# Patient Record
Sex: Female | Born: 1975 | Race: Black or African American | Hispanic: No | Marital: Married | Smoking: Never smoker
Health system: Southern US, Community
[De-identification: ages and names within clinical notes are randomized; demographics above are authoritative.]

## PROBLEM LIST (undated history)

## (undated) DIAGNOSIS — M1712 Unilateral primary osteoarthritis, left knee: Secondary | ICD-10-CM

## (undated) DIAGNOSIS — E039 Hypothyroidism, unspecified: Secondary | ICD-10-CM

## (undated) DIAGNOSIS — R87629 Unspecified abnormal cytological findings in specimens from vagina: Secondary | ICD-10-CM

## (undated) DIAGNOSIS — S83106A Unspecified dislocation of unspecified knee, initial encounter: Secondary | ICD-10-CM

## (undated) DIAGNOSIS — M2242 Chondromalacia patellae, left knee: Secondary | ICD-10-CM

## (undated) DIAGNOSIS — M6752 Plica syndrome, left knee: Secondary | ICD-10-CM

## (undated) HISTORY — DX: Unspecified abnormal cytological findings in specimens from vagina: R87.629

---
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
## 2010-08-14 ENCOUNTER — Encounter: Payer: Self-pay | Admitting: Obstetrics & Gynecology

## 2010-08-14 ENCOUNTER — Ambulatory Visit: Payer: Self-pay | Admitting: Obstetrics & Gynecology

## 2010-08-14 LAB — CONVERTED CEMR LAB
Basophils Absolute: 0 10*3/uL (ref 0.0–0.1)
Basophils Relative: 0 % (ref 0–1)
Hepatitis B Surface Ag: NEGATIVE
Hgb A2 Quant: 2.3 % (ref 2.2–3.2)
Hgb A: 97.7 % (ref 96.8–97.8)
Lymphocytes Relative: 25 % (ref 12–46)
MCHC: 33.3 g/dL (ref 30.0–36.0)
Neutro Abs: 7.5 10*3/uL (ref 1.7–7.7)
Neutrophils Relative %: 66 % (ref 43–77)
Platelets: 300 10*3/uL (ref 150–400)
RDW: 13.2 % (ref 11.5–15.5)
Rubella: 173.5 intl units/mL — ABNORMAL HIGH

## 2010-08-21 ENCOUNTER — Ambulatory Visit (HOSPITAL_COMMUNITY): Admission: RE | Admit: 2010-08-21 | Discharge: 2010-08-21 | Payer: Self-pay | Admitting: Obstetrics & Gynecology

## 2010-08-27 ENCOUNTER — Ambulatory Visit: Payer: Self-pay | Admitting: Family Medicine

## 2010-08-28 ENCOUNTER — Encounter: Payer: Self-pay | Admitting: Family Medicine

## 2010-09-18 ENCOUNTER — Ambulatory Visit: Payer: Self-pay | Admitting: Family Medicine

## 2010-09-19 ENCOUNTER — Encounter: Payer: Self-pay | Admitting: Family Medicine

## 2010-09-20 ENCOUNTER — Ambulatory Visit (HOSPITAL_COMMUNITY): Admission: RE | Admit: 2010-09-20 | Discharge: 2010-09-20 | Payer: Self-pay | Admitting: Family Medicine

## 2010-09-20 ENCOUNTER — Encounter: Payer: Self-pay | Admitting: Family Medicine

## 2010-10-03 ENCOUNTER — Ambulatory Visit: Payer: Self-pay | Admitting: Obstetrics & Gynecology

## 2010-10-11 ENCOUNTER — Ambulatory Visit (HOSPITAL_COMMUNITY)
Admission: RE | Admit: 2010-10-11 | Discharge: 2010-10-11 | Payer: Self-pay | Source: Home / Self Care | Admitting: Family Medicine

## 2010-10-18 ENCOUNTER — Encounter: Payer: Self-pay | Admitting: Family Medicine

## 2010-10-18 ENCOUNTER — Ambulatory Visit (HOSPITAL_COMMUNITY): Admission: RE | Admit: 2010-10-18 | Discharge: 2010-10-18 | Payer: Self-pay | Admitting: Family Medicine

## 2010-10-30 ENCOUNTER — Ambulatory Visit: Payer: Self-pay | Admitting: Obstetrics & Gynecology

## 2010-10-30 ENCOUNTER — Encounter: Payer: Self-pay | Admitting: Family Medicine

## 2010-10-30 ENCOUNTER — Ambulatory Visit (HOSPITAL_COMMUNITY)
Admission: RE | Admit: 2010-10-30 | Discharge: 2010-10-30 | Payer: Self-pay | Source: Home / Self Care | Admitting: Family Medicine

## 2010-11-18 ENCOUNTER — Ambulatory Visit: Payer: Self-pay | Admitting: Obstetrics and Gynecology

## 2010-12-04 ENCOUNTER — Ambulatory Visit (HOSPITAL_COMMUNITY)
Admission: RE | Admit: 2010-12-04 | Discharge: 2010-12-04 | Payer: Self-pay | Source: Home / Self Care | Attending: Family Medicine | Admitting: Family Medicine

## 2010-12-04 ENCOUNTER — Encounter: Payer: Self-pay | Admitting: Family Medicine

## 2010-12-16 ENCOUNTER — Ambulatory Visit
Admission: RE | Admit: 2010-12-16 | Discharge: 2010-12-16 | Payer: Self-pay | Source: Home / Self Care | Attending: Obstetrics and Gynecology | Admitting: Obstetrics and Gynecology

## 2011-01-06 ENCOUNTER — Ambulatory Visit
Admission: RE | Admit: 2011-01-06 | Discharge: 2011-01-06 | Payer: Self-pay | Source: Home / Self Care | Attending: Obstetrics and Gynecology | Admitting: Obstetrics and Gynecology

## 2011-01-07 ENCOUNTER — Encounter: Payer: Self-pay | Admitting: Obstetrics and Gynecology

## 2011-01-08 ENCOUNTER — Encounter: Payer: Self-pay | Admitting: Obstetrics and Gynecology

## 2011-01-08 LAB — CONVERTED CEMR LAB
Basophils Absolute: 0 10*3/uL (ref 0.0–0.1)
Basophils Relative: 0 % (ref 0–1)
Eosinophils Relative: 1 % (ref 0–5)
HCT: 35 % — ABNORMAL LOW (ref 36.0–46.0)
Hemoglobin: 10.4 g/dL — ABNORMAL LOW (ref 12.0–15.0)
Lymphocytes Relative: 17 % (ref 12–46)
MCHC: 29.7 g/dL — ABNORMAL LOW (ref 30.0–36.0)
Monocytes Absolute: 0.5 10*3/uL (ref 0.1–1.0)
Monocytes Relative: 4 % (ref 3–12)
RBC: 3.51 M/uL — ABNORMAL LOW (ref 3.87–5.11)
RDW: 16.5 % — ABNORMAL HIGH (ref 11.5–15.5)

## 2011-01-27 ENCOUNTER — Encounter: Payer: 59 | Admitting: Obstetrics & Gynecology

## 2011-01-27 DIAGNOSIS — Z3480 Encounter for supervision of other normal pregnancy, unspecified trimester: Secondary | ICD-10-CM

## 2011-02-10 ENCOUNTER — Encounter: Payer: 59 | Admitting: Obstetrics & Gynecology

## 2011-02-10 DIAGNOSIS — Z3480 Encounter for supervision of other normal pregnancy, unspecified trimester: Secondary | ICD-10-CM

## 2011-02-24 ENCOUNTER — Encounter: Payer: 59 | Admitting: Obstetrics & Gynecology

## 2011-02-24 DIAGNOSIS — Z3480 Encounter for supervision of other normal pregnancy, unspecified trimester: Secondary | ICD-10-CM

## 2011-03-04 ENCOUNTER — Encounter: Payer: 59 | Admitting: Obstetrics and Gynecology

## 2011-03-04 ENCOUNTER — Other Ambulatory Visit: Payer: Self-pay | Admitting: Obstetrics & Gynecology

## 2011-03-04 ENCOUNTER — Encounter: Payer: Self-pay | Admitting: Obstetrics & Gynecology

## 2011-03-04 DIAGNOSIS — O3660X0 Maternal care for excessive fetal growth, unspecified trimester, not applicable or unspecified: Secondary | ICD-10-CM

## 2011-03-04 DIAGNOSIS — Z3480 Encounter for supervision of other normal pregnancy, unspecified trimester: Secondary | ICD-10-CM

## 2011-03-05 ENCOUNTER — Encounter: Payer: Self-pay | Admitting: Obstetrics & Gynecology

## 2011-03-07 ENCOUNTER — Ambulatory Visit (HOSPITAL_COMMUNITY)
Admission: RE | Admit: 2011-03-07 | Discharge: 2011-03-07 | Disposition: A | Discharge status: Home / Self Care | Payer: 59 | Source: Ambulatory Visit | Attending: Obstetrics & Gynecology | Admitting: Obstetrics & Gynecology

## 2011-03-07 DIAGNOSIS — Z3689 Encounter for other specified antenatal screening: Secondary | ICD-10-CM | POA: Insufficient documentation

## 2011-03-07 DIAGNOSIS — O3660X0 Maternal care for excessive fetal growth, unspecified trimester, not applicable or unspecified: Secondary | ICD-10-CM | POA: Insufficient documentation

## 2011-03-10 ENCOUNTER — Encounter: Payer: 59 | Admitting: Obstetrics and Gynecology

## 2011-03-10 DIAGNOSIS — Z3480 Encounter for supervision of other normal pregnancy, unspecified trimester: Secondary | ICD-10-CM

## 2011-03-18 ENCOUNTER — Encounter: Payer: 59 | Admitting: Obstetrics and Gynecology

## 2011-03-18 DIAGNOSIS — Z3480 Encounter for supervision of other normal pregnancy, unspecified trimester: Secondary | ICD-10-CM

## 2011-03-18 DIAGNOSIS — E669 Obesity, unspecified: Secondary | ICD-10-CM

## 2011-03-18 DIAGNOSIS — O99210 Obesity complicating pregnancy, unspecified trimester: Secondary | ICD-10-CM

## 2011-03-24 ENCOUNTER — Encounter: Payer: 59 | Admitting: Obstetrics & Gynecology

## 2011-03-24 DIAGNOSIS — Z3480 Encounter for supervision of other normal pregnancy, unspecified trimester: Secondary | ICD-10-CM

## 2011-03-24 DIAGNOSIS — E669 Obesity, unspecified: Secondary | ICD-10-CM

## 2011-03-24 DIAGNOSIS — O99210 Obesity complicating pregnancy, unspecified trimester: Secondary | ICD-10-CM

## 2011-03-25 ENCOUNTER — Inpatient Hospital Stay (HOSPITAL_COMMUNITY)
Admission: AD | Admit: 2011-03-25 | Discharge: 2011-03-28 | DRG: 774 | Disposition: A | Discharge status: Home / Self Care | Payer: 59 | Source: Ambulatory Visit | Attending: Obstetrics & Gynecology | Admitting: Obstetrics & Gynecology

## 2011-03-25 LAB — CBC
HCT: 35.7 % — ABNORMAL LOW (ref 36.0–46.0)
MCH: 28.8 pg (ref 26.0–34.0)
MCV: 87.9 fL (ref 78.0–100.0)
Platelets: 140 10*3/uL — ABNORMAL LOW (ref 150–400)
RBC: 4.06 MIL/uL (ref 3.87–5.11)

## 2011-03-27 LAB — CBC
HCT: 30.9 % — ABNORMAL LOW (ref 36.0–46.0)
MCH: 28.7 pg (ref 26.0–34.0)
MCV: 90.4 fL (ref 78.0–100.0)
Platelets: 116 10*3/uL — ABNORMAL LOW (ref 150–400)
RBC: 3.42 MIL/uL — ABNORMAL LOW (ref 3.87–5.11)
WBC: 14.2 10*3/uL — ABNORMAL HIGH (ref 4.0–10.5)

## 2011-04-01 ENCOUNTER — Encounter: Payer: 59 | Admitting: Obstetrics and Gynecology

## 2011-04-22 ENCOUNTER — Ambulatory Visit (INDEPENDENT_AMBULATORY_CARE_PROVIDER_SITE_OTHER): Payer: 59 | Admitting: Obstetrics & Gynecology

## 2011-04-22 DIAGNOSIS — O99340 Other mental disorders complicating pregnancy, unspecified trimester: Secondary | ICD-10-CM

## 2011-05-12 ENCOUNTER — Ambulatory Visit (INDEPENDENT_AMBULATORY_CARE_PROVIDER_SITE_OTHER): Payer: 59 | Admitting: Obstetrics and Gynecology

## 2011-05-12 DIAGNOSIS — O99345 Other mental disorders complicating the puerperium: Secondary | ICD-10-CM

## 2011-05-14 ENCOUNTER — Ambulatory Visit: Payer: 59 | Admitting: Obstetrics & Gynecology

## 2012-02-03 ENCOUNTER — Ambulatory Visit (INDEPENDENT_AMBULATORY_CARE_PROVIDER_SITE_OTHER): Payer: 59 | Admitting: Obstetrics & Gynecology

## 2012-02-03 ENCOUNTER — Encounter: Payer: Self-pay | Admitting: Obstetrics & Gynecology

## 2012-02-03 VITALS — BP 106/74 | HR 82 | Ht 65.0 in | Wt 231.0 lb

## 2012-02-03 DIAGNOSIS — F32A Depression, unspecified: Secondary | ICD-10-CM

## 2012-02-03 DIAGNOSIS — F329 Major depressive disorder, single episode, unspecified: Secondary | ICD-10-CM | POA: Insufficient documentation

## 2012-02-03 DIAGNOSIS — F410 Panic disorder [episodic paroxysmal anxiety] without agoraphobia: Secondary | ICD-10-CM

## 2012-02-03 DIAGNOSIS — Z124 Encounter for screening for malignant neoplasm of cervix: Secondary | ICD-10-CM

## 2012-02-03 DIAGNOSIS — N915 Oligomenorrhea, unspecified: Secondary | ICD-10-CM

## 2012-02-03 DIAGNOSIS — Z01419 Encounter for gynecological examination (general) (routine) without abnormal findings: Secondary | ICD-10-CM

## 2012-02-03 DIAGNOSIS — N926 Irregular menstruation, unspecified: Secondary | ICD-10-CM

## 2012-02-03 DIAGNOSIS — Z3009 Encounter for other general counseling and advice on contraception: Secondary | ICD-10-CM

## 2012-02-03 HISTORY — DX: Depression, unspecified: F32.A

## 2012-02-03 HISTORY — DX: Panic disorder (episodic paroxysmal anxiety): F41.0

## 2012-02-03 NOTE — Progress Notes (Signed)
  Subjective:     Cheryl Martinez is a 36 y.o. [phone] female and is here for a comprehensive physical exam. The patient reports having irregular menstrual periods since her delivery in 03/2011. Periods come about every 2-3 months, last for one day.  She has noticed she gained a lot of weight since her delivery.  No other symptoms.  Patient is interested in discussing long term contraception.  Not interested in surgery for now; husband doe snot want vasectomy.  Patient also reports having panic attacks; she is not taking her Effexor anymore.  History   Social History  . Marital Status: Married    Spouse Name: N/A    Number of Children: N/A  . Years of Education: N/A   Occupational History  . Not on file.   Social History Main Topics  . Smoking status: Never Smoker   . Smokeless tobacco: Not on file  . Alcohol Use: No  . Drug Use: No  . Sexually Active: Yes -- Female partner(s)    Birth Control/ Protection: Condom   Other Topics Concern  . Not on file   Social History Narrative  . No narrative on file   Health Maintenance  Topic Date Due  . Pap Smear  10/27/1996  . Tetanus/tdap  10/27/1997  . Influenza Vaccine  09/08/2011    The following portions of the patient's history were reviewed and updated as appropriate: allergies, current medications, past family history, past medical history, past social history, past surgical history and problem list.  Review of Systems Pertinent items are noted in HPI.   Objective:    Blood pressure 106/74, pulse 82, height 5\' 5"  (1.651 m), weight 231 lb (104.781 kg), not currently breastfeeding. GENERAL: Well-developed, well-nourished female in no acute distress.  HEENT: Normocephalic, atraumatic. Sclerae anicteric.  NECK: Supple. Normal thyroid.  LUNGS: Clear to auscultation bilaterally.  HEART: Regular rate and rhythm. BREASTS: Symmetric with everted nipples. No masses, skin changes, nipple drainage, or lymphadenopathy. ABDOMEN: Soft,  obese, nontender, nondistended. No organomegaly. PELVIC: Normal external female genitalia. Vagina is pink and rugated.  Normal discharge. Normal cervix contour. Pap smear obtained. Uterus is normal in size. No adnexal mass or tenderness.  EXTREMITIES: No cyanosis, clubbing, or edema, 2+ distal pulses.   Assessment:   Annual gynecologic exam Long term contraception counseling Oligomenorrhea   Panic attacks Plan:   Follow up pap smear  Reviewed all forms of long term birth control options available including abstinence; over the counter methods; oral contraceptive medication including pill, patch, or ring; Depo-Provera injection; Mirena and Paragard IUDs; permanent sterilization options including vasectomy, tubal ligation, or tubal occlusion using Essure coils. Risks and benefits reviewed.  Questions were answered.  Patient interested in Mirena, risks, benefits reviewed, pamphlet given to patient. She will return for IUD insertion.  As for her oligomenorrhea, will check TSH, PRL, total testosterone, free testosterone in conjuction with her CBC, CMET, fasting lipid panel.  Patient will come back in fasting for these labs and also her Mirena IUD placement  Recommended restarting her Effexor for her panic attacks, offered referral to mental health provider.  Patient declines referral for now.  Will continue to monitor.

## 2012-02-03 NOTE — Patient Instructions (Addendum)
MIRENA Levonorgestrel intrauterine device (IUD) What is this medicine?  MIRENA IUD (is a contraceptive (birth control) device.This type of IUD contains the hormone progestin (synthetic progesterone). The progestin thickens the cervical mucus and prevents sperm from entering the uterus, and it also thins the uterine lining. There is no evidence that the hormone IUD prevents implantation. The hormone IUD can stay in place for up to 5 years.  This medicine may be used for other purposes; ask your health care provider or pharmacist if you have questions. How should I use this medicine? This device is placed inside the uterus by a health care professional. An IUD is the most cost-effective birth control if left in place for the full duration. It may be removed at any time. RISKS AND COMPLICATIONS  Accidental puncture (perforation) of the uterus.   Accidental placement of the IUD either in the muscle layer of the uterus (myometrium) or outside the uterus. If this happen, the IUD can be found essentially floating around the bowels. When this happens, the IUD must be taken out surgically.   The IUD may fall out of the uterus (expulsion). This is more common in women who have recently had a child.    Pregnancy in the fallopian tube (ectopic).  BEFORE THE PROCEDURE  Schedule the IUD insertion for when you will have your menstrual period or right after, to make sure you are not pregnant. Placement of the IUD is better tolerated shortly after a menstrual cycle.   You may need to take tests or be examined to make sure you are not pregnant.   You may be required to take a pregnancy test.   You may be required to get checked for sexually transmitted infections (STIs) prior to placement. Placing an IUD in someone who has an infection can make an infection worse.   You may be given a pain reliever to take 1 or 2 hours before the procedure.   An exam will be performed to determine the size and position  of your uterus.   Ask your caregiver about changing or stopping your regular medicines.  PROCEDURE   A tool (speculum) is placed in the vagina. This allows your caregiver to see the lower part of the uterus (cervix).   The cervix is prepped with a medicine that lowers the risk of infection.   You may be given a medicine to numb each side of the cervix (intracervical or paracervical block). This is used to block and control any discomfort with insertion.   A tool (uterine sound) is inserted into the uterus to determine the length of the uterine cavity and the direction the uterus may be tilted.   A slim instrument (IUD inserter) is inserted through the cervical canal and into your uterus.   The IUD is placed in the uterine cavity and the insertion device is removed.   The nylon string that is attached to the IUD, and used for eventual IUD removal, is trimmed. It is trimmed so that it lays high in the vagina, just outside the cervix.  AFTER THE PROCEDURE  You may have bleeding after the procedure. This is normal. It varies from light spotting for a few days to menstrual-like bleeding.   You may have mild cramping.   Practice checking the string coming out of the cervix to make sure the IUD remains in the uterus. If you cannot feel the string, you should schedule a "string check" with your caregiver.   If you had a  hormone IUD inserted, expect that your period may be lighter or nonexistent within a year's time (though this is not always the case). There may be delayed fertility with the hormone IUD as a result of its progesterone effect. When you are ready to become pregnant, it is suggested to have the IUD removed up to 1 year in advance.   Yearly exams are advised.  What may interact with this medicine? Do not take this medicine with any of the following medications: -amprenavir -bosentan -fosamprenavir This medicine may also interact with the following  medications: -aprepitant -barbiturate medicines for inducing sleep or treating seizures -bexarotene -griseofulvin -medicines to treat seizures like carbamazepine, ethotoin, felbamate, oxcarbazepine, phenytoin, topiramate -modafinil -pioglitazone -rifabutin -rifampin -rifapentine -some medicines to treat HIV infection like atazanavir, indinavir, lopinavir, nelfinavir, tipranavir, ritonavir -St. John's wort -warfarin This list may not describe all possible interactions. Give your health care provider a list of all the medicines, herbs, non-prescription drugs, or dietary supplements you use. Also tell them if you smoke, drink alcohol, or use illegal drugs. Some items may interact with your medicine. What should I watch for while using this medicine? Visit your doctor or health care professional for regular check ups. See your doctor if you or your partner has sexual contact with others, becomes HIV positive, or gets a sexual transmitted disease. This product does not protect you against HIV infection (AIDS) or other sexually transmitted diseases. You can check the placement of the IUD yourself by reaching up to the top of your vagina with clean fingers to feel the threads. Do not pull on the threads. It is a good habit to check placement after each menstrual period. Call your doctor right away if you feel more of the IUD than just the threads or if you cannot feel the threads at all. The IUD may come out by itself. You may become pregnant if the device comes out. If you notice that the IUD has come out use a backup birth control method like condoms and call your health care provider. Using tampons will not change the position of the IUD and are okay to use during your period. What side effects may I notice from receiving this medicine? Side effects that you should report to your doctor or health care professional as soon as possible: -allergic reactions like skin rash, itching or hives, swelling  of the face, lips, or tongue -fever, flu-like symptoms -genital sores -high blood pressure -no menstrual period for 6 weeks during use -pain, swelling, warmth in the leg -pelvic pain or tenderness -severe or sudden headache -signs of pregnancy -stomach cramping -sudden shortness of breath -trouble with balance, talking, or walking -unusual vaginal bleeding, discharge -yellowing of the eyes or skin Side effects that usually do not require medical attention (report to your doctor or health care professional if they continue or are bothersome): -acne -breast pain -change in sex drive or performance -changes in weight -cramping, dizziness, or faintness while the device is being inserted -headache -irregular menstrual bleeding within first 3 to 6 months of use -nausea This list may not describe all possible side effects. Call your doctor for medical advice about side effects. You may report side effects to FDA at 1-800-FDA-1088. Where should I keep my medicine? This does not apply. NOTE: This sheet is a summary. It may not cover all possible information. If you have questions about this medicine, talk to your doctor, pharmacist, or health care provider.  2012, [employer]. (12/15/2008 6:39:08 PM)  Preventative  Care for Adults, Female A healthy lifestyle and preventative care can promote health and wellness. Preventative health guidelines for women include the following key practices:  A routine yearly physical is a good way to check with your caregiver about your health and preventative screening. It is a chance to share any concerns and updates on your health, and to receive a thorough exam.   Visit your dentist for a routine exam and preventative care every 6 months. Brush your teeth twice a day and floss once a day. Good oral hygiene prevents tooth decay and gum disease.   The frequency of eye exams is based on your age, health, family medical history, use of contact  lenses, and other factors. Follow your caregiver's recommendations for frequency of eye exams.   Eat a healthy diet. Foods like vegetables, fruits, whole grains, low-fat dairy products, and lean protein foods contain the nutrients you need without too many calories. Decrease your intake of foods high in solid fats, added sugars, and salt. Eat the right amount of calories for you.Get information about a proper diet from your caregiver, if necessary.   Regular physical exercise is one of the most important things you can do for your health. Most adults should get at least 150 minutes of moderate-intensity exercise (any activity that increases your heart rate and causes you to sweat) each week. In addition, most adults need muscle-strengthening exercises on 2 or more days a week.   Maintain a healthy weight. The body mass index (BMI) is a screening tool to identify possible weight problems. It provides an estimate of body fat based on height and weight. Your caregiver can help determine your BMI, and can help you achieve or maintain a healthy weight.For adults 20 years and older:   A BMI below 18.5 is considered underweight.   A BMI of 18.5 to 24.9 is normal.   A BMI of 25 to 29.9 is considered overweight.   A BMI of 30 and above is considered obese.   Maintain normal blood lipids and cholesterol levels by exercising and minimizing your intake of saturated fat. Eat a balanced diet with plenty of fruit and vegetables. Blood tests for lipids and cholesterol should begin at age 71 and be repeated every 5 years. If your lipid or cholesterol levels are high, you are over 50, or you are a high risk for heart disease, you may need your cholesterol levels checked more frequently.Ongoing high lipid and cholesterol levels should be treated with medicines if diet and exercise are not effective.   If you smoke, find out from your caregiver how to quit. If you do not use tobacco, do not start.   If you are  pregnant, do not drink alcohol. If you are breastfeeding, be very cautious about drinking alcohol. If you are not pregnant and choose to drink alcohol, do not exceed 1 drink per day. One drink is considered to be 12 ounces (355 mL) of beer, 5 ounces (148 mL) of wine, or 1.5 ounces (44 mL) of liquor.   Avoid use of street drugs. Do not share needles with anyone. Ask for help if you need support or instructions about stopping the use of drugs.   High blood pressure causes heart disease and increases the risk of stroke. Your blood pressure should be checked at least every 1 to 2 years. Ongoing high blood pressure should be treated with medicines if weight loss and exercise are not effective.   If you are 55 to 34  years old, ask your caregiver if you should take aspirin to prevent strokes.   Diabetes screening involves taking a blood sample to check your fasting blood sugar level. This should be done once every 3 years, after age 17, if you are within normal weight and without risk factors for diabetes. Testing should be considered at a younger age or be carried out more frequently if you are overweight and have at least 1 risk factor for diabetes.   Breast cancer screening is essential preventative care for women. You should practice "breast self-awareness." This means understanding the normal appearance and feel of your breasts and may include breast self-examination. Any changes detected, no matter how small, should be reported to a caregiver. Women in their 40s and 30s should have a clinical breast exam (CBE) by a caregiver as part of a regular health exam every 1 to 3 years. After age 92, women should have a CBE every year. Starting at age 18, women should consider having a mammogram (breast X-ray) every year. Women who have a family history of breast cancer should talk to their caregiver about genetic screening. Women at a high risk of breast cancer should talk to their caregiver about having an MRI and  a mammogram every year.   The Pap test is a screening test for cervical cancer. A Pap test can show cell changes on the cervix that might become cervical cancer if left untreated. A Pap test is a procedure in which cells are obtained and examined from the lower end of the uterus (cervix).   Women should have a Pap test starting at age 65.   Between ages 78 and 79, Pap tests should be repeated every 2 years.   Beginning at age 68, you should have a Pap test every 3 years as long as the past 3 Pap tests have been normal.   Some women have medical problems that increase the chance of getting cervical cancer. Talk to your caregiver about these problems. It is especially important to talk to your caregiver if a new problem develops soon after your last Pap test. In these cases, your caregiver may recommend more frequent screening and Pap tests.   The above recommendations are the same for women who have or have not gotten the vaccine for human papillomavirus (HPV).   If you had a hysterectomy for a problem that was not cancer or a condition that could lead to cancer, then you no longer need Pap tests. Even if you no longer need a Pap test, a regular exam is a good idea to make sure no other problems are starting.   If you are between ages 89 and 35, and you have had normal Pap tests going back 10 years, you no longer need Pap tests. Even if you no longer need a Pap test, a regular exam is a good idea to make sure no other problems are starting.   If you have had past treatment for cervical cancer or a condition that could lead to cancer, you need Pap tests and screening for cancer for at least 20 years after your treatment.   If Pap tests have been discontinued, risk factors (such as a new sexual partner) need to be reassessed to determine if screening should be resumed.   The HPV test is an additional test that may be used for cervical cancer screening. The HPV test looks for the virus that can  cause the cell changes on the cervix. The cells collected  during the Pap test can be tested for HPV. The HPV test could be used to screen women aged 66 years and older, and should be used in women of any age who have unclear Pap test results. After the age of 71, women should have HPV testing at the same frequency as a Pap test.   Colorectal cancer can be detected and often prevented. Most routine colorectal cancer screening begins at the age of 35 and continues through age 73. However, your caregiver may recommend screening at an earlier age if you have risk factors for colon cancer. On a yearly basis, your caregiver may provide home test kits to check for hidden blood in the stool. Use of a small camera at the end of a tube, to directly examine the colon (sigmoidoscopy or colonoscopy), can detect the earliest forms of colorectal cancer. Talk to your caregiver about this at age 37, when routine screening begins. Direct examination of the colon should be repeated every 5 to 10 years through age 34, unless early forms of pre-cancerous polyps or small growths are found.   Practice safe sex. Use condoms and avoid high-risk sexual practices to reduce the spread of sexually transmitted infections (STIs). STIs include gonorrhea, chlamydia, syphilis, trichomonas, herpes, HPV, and human immunodeficiency virus (HIV). Herpes, HIV, and HPV are viral illnesses that have no cure. They can result in disability, cancer, and death. Sexually active women aged 61 and younger should be checked for Chlamydia. Older women with new or multiple partners should also be tested for Chlamydia. Testing for other STIs is recommended if you are sexually active and at increased risk.   Osteoporosis is a disease in which the bones lose minerals and strength with aging. This can result in serious bone fractures. The risk of osteoporosis can be identified using a bone density scan. Women ages 84 and over and women at risk for fractures or  osteoporosis should discuss screening with their caregivers. Ask your caregiver whether you should take a calcium supplement or vitamin D to reduce the rate of osteoporosis.   Menopause can be associated with physical symptoms and risks. Hormone replacement therapy is available to decrease symptoms and risks. You should talk to your caregiver about whether hormone replacement therapy is right for you.   Use sunscreen with skin protection factor (SPF) of 30 or more. Apply sunscreen liberally and repeatedly throughout the day. You should seek shade when your shadow is shorter than you. Protect yourself by wearing long sleeves, pants, a wide-brimmed hat, and sunglasses year round, whenever you are outdoors.   Once a month, do a whole body skin exam, using a mirror to look at the skin on your back. Notify your caregiver of new moles, moles that have irregular borders, moles that are larger than a pencil eraser, or moles that have changed in shape or color.   Stay current with required immunizations.   Influenza. You need a dose every fall (or winter). The composition of the flu vaccine changes each year, so being vaccinated once is not enough.   Pneumococcal polysaccharide. You need 1 to 2 doses if you smoke cigarettes or if you have certain chronic medical conditions. You need 1 dose at age 66 (or older) if you have never been vaccinated.   Tetanus, diphtheria, pertussis (Tdap, Td). Get 1 dose of Tdap vaccine if you are younger than age 64 years, are over 70 and have contact with an infant, are a Research scientist (physical sciences), are pregnant, or simply want to  be protected from whooping cough. After that, you need a Td booster dose every 10 years. Consult your caregiver if you have not had at least 3 tetanus and diphtheria-containing shots sometime in your life or have a deep or dirty wound.   HPV. You need this vaccine if you are a woman age 58 years or younger. The vaccine is given in 3 doses over 6 months.    Measles, mumps, rubella (MMR). You need at least 1 dose of MMR if you were born in 1957 or later. You may also need a 2nd dose.   Meningococcal. If you are age 52 to 74 years and a Orthoptist living in a residence hall, or have one of several medical conditions, you need to get vaccinated against meningococcal disease. You may also need additional booster doses.   Zoster (shingles). If you are age 61 years or older, you should get this vaccine.   Varicella (chickenpox). If you have never had chickenpox or you were vaccinated but received only 1 dose, talk to your caregiver to find out if you need this vaccine.   Hepatitis A. You need this vaccine if you have a specific risk factor for hepatitis A virus infection or you simply wish to be protected from this disease. The vaccine is usually given as 2 doses, 6 to 18 months apart.   Hepatitis B. You need this vaccine if you have a specific risk factor for hepatitis B virus infection or you simply wish to be protected from this disease. The vaccine is given in 3 doses, usually over 6 months.  Preventative Services / Frequency Ages 48 to 35  Blood pressure check.** / Every 1 to 2 years.   Lipid and cholesterol check.**/ Every 5 years beginning at age 18.   Clinical breast exam.** / Every 3 years for women in their 59s and 30s.   Pap Test.** / Every 2 years from ages 60 through 64. Every 3 years starting at age 47 years through age 74 or 20 with a history of 3 consecutive normal Pap tests.   HPV Screening.** / Every 3 years from ages 15 through ages 52 to 15 with a history of 3 consecutive normal Pap tests.   Skin self-exam. / Monthly.   Influenza immunization.** / Every year.   Pneumococcal polysaccharide immunization.** / 1 to 2 doses if you smoke cigarettes or if you have certain chronic medical conditions.   Tetanus, diphtheria, pertussis (Tdap,Td) immunization. / A one-time dose of Tdap vaccine. After that, you need a  Td booster dose every 10 years.   HPV immunization. / 3 doses over 6 months, if 26 and younger.   Measles, mumps, rubella (MMR) immunization. / You need at least 1 dose of MMR if you were born in 1957 or later. You may also need a 2nd dose.   Meningococcal immunization. / 1 dose if you are age 33 to 82 years and a Orthoptist living in a residence hall, or have one of several medical conditions, you need to get vaccinated against meningococcal disease. You may also need additional booster doses.   Varicella immunization. **/ Consult your caregiver.   Hepatitis A immunization. ** / Consult your caregiver. 2 doses, 6 to 18 months apart.   Hepatitis B immunization.** / Consult your caregiver. 3 doses usually over 6 months.  Ages 72 to 58  Blood pressure check.** / Every 1 to 2 years.   Lipid and cholesterol check.**/ Every 5 years beginning  at age 85.   Clinical breast exam.** / Every year after age 64.   Mammogram.** / Every year beginning at age 20 and continuing for as long as you are in good health. Consult with your caregiver.   Pap Test.** / Every 3 years starting at age 46 years through age 30 or 3 with a history of 3 consecutive normal Pap tests.   HPV Screening.** / Every 3 years from ages 45 through ages 56 to 68 with a history of 3 consecutive normal Pap tests.   Fecal occult blood test (FOBT) of stool. / Every year beginning at age 91 and continuing until age 42. You may not have to do this test if you get colonoscopy every 10 years.   Flexible sigmoidoscopy** or colonoscopy.** / Every 5 years for a flexible sigmoidoscopy or every 10 years for a colonoscopy beginning at age 39 and continuing until age 73.   Skin self-exam. / Monthly.   Influenza immunization.** / Every year.   Pneumococcal polysaccharide immunization.** / 1 to 2 doses if you smoke cigarettes or if you have certain chronic medical conditions.   Tetanus, diphtheria, pertussis (Tdap/Td)  immunization.** / A one-time dose of Tdap vaccine. After that, you need a Td booster dose every 10 years.   Measles, mumps, rubella (MMR) immunization. / You need at least 1 dose of MMR if you were born in 1957 or later. You may also need a 2nd dose.   Varicella immunization. **/ Consult your caregiver.   Meningococcal immunization.** / Consult your caregiver.     Hepatitis A immunization. ** / Consult your caregiver. 2 doses, 6 to 18 months apart.   Hepatitis B immunization.** / Consult your caregiver. 3 doses, usually over 6 months.  Ages 81 and over  Blood pressure check.** / Every 1 to 2 years.   Lipid and cholesterol check.**/ Every 5 years beginning at age 60.   Clinical breast exam.** / Every year after age 79.   Mammogram.** / Every year beginning at age 33 and continuing for as long as you are in good health. Consult with your caregiver.   Pap Test,** / Every 3 years starting at age 81 years through age 74 or 19 with a 3 consecutive normal Pap tests. Testing can be stopped between 65 and 70 with 3 consecutive normal Pap tests and no abnormal Pap or HPV tests in the past 10 years.   HPV Screening.** / Every 3 years from ages 20 through ages 54 or 3 with a history of 3 consecutive normal Pap tests. Testing can be stopped between 65 and 70 with 3 consecutive normal Pap tests and no abnormal Pap or HPV tests in the past 10 years.   Fecal occult blood test (FOBT) of stool. / Every year beginning at age 7 and continuing until age 39. You may not have to do this test if you get colonoscopy every 10 years.   Flexible sigmoidoscopy** or colonoscopy.** / Every 5 years for a flexible sigmoidoscopy or every 10 years for a colonoscopy beginning at age 73 and continuing until age 21.   Osteoporosis screening.** / A one-time screening for women ages 66 and over and women at risk for fractures or osteoporosis.   Skin self-exam. / Monthly.   Influenza immunization.** / Every year.    Pneumococcal polysaccharide immunization.** / 1 dose at age 71 (or older) if you have never been vaccinated.   Tetanus, diphtheria, pertussis (Tdap, Td) immunization. / A one-time dose of Tdap  vaccine if you are over 65 and have contact with an infant, are a healthcare worker, or simply want to be protected from whooping cough. After that, you need a Td booster dose every 10 years.   Varicella immunization. **/ Consult your caregiver.   Meningococcal immunization.** / Consult your caregiver.   Hepatitis A immunization. ** / Consult your caregiver. 2 doses, 6 to 18 months apart.   Hepatitis B immunization.** / Check with your caregiver. 3 doses, usually over 6 months.  ** Family history and personal history of risk and conditions may change your caregiver's recommendations. Document Released: 01/20/2002 Document Revised: 08/06/2011 Document Reviewed: 04/21/2011 Exit[employer] Patient Information 2012 Exit[employer], [facility] 

## 2012-02-16 ENCOUNTER — Ambulatory Visit: Payer: 59 | Admitting: Obstetrics & Gynecology

## 2012-02-19 ENCOUNTER — Ambulatory Visit: Payer: 59 | Admitting: Obstetrics & Gynecology

## 2012-02-24 ENCOUNTER — Encounter: Payer: Self-pay | Admitting: Family Medicine

## 2012-02-24 ENCOUNTER — Ambulatory Visit (INDEPENDENT_AMBULATORY_CARE_PROVIDER_SITE_OTHER): Payer: 59 | Admitting: Family Medicine

## 2012-02-24 VITALS — BP 115/70 | HR 73 | Ht 65.0 in | Wt 232.0 lb

## 2012-02-24 DIAGNOSIS — Z309 Encounter for contraceptive management, unspecified: Secondary | ICD-10-CM

## 2012-02-24 DIAGNOSIS — Z130 Encounter for screening for diseases of the blood and blood-forming organs and certain disorders involving the immune mechanism: Secondary | ICD-10-CM

## 2012-02-24 DIAGNOSIS — R635 Abnormal weight gain: Secondary | ICD-10-CM

## 2012-02-24 DIAGNOSIS — Z13220 Encounter for screening for lipoid disorders: Secondary | ICD-10-CM

## 2012-02-24 LAB — LIPID PANEL
HDL: 47 mg/dL (ref >39)
LDL Cholesterol: 181 mg/dL — ABNORMAL HIGH (ref 0–99)
VLDL: 46 mg/dL — ABNORMAL HIGH (ref 0–40)

## 2012-02-24 LAB — COMPREHENSIVE METABOLIC PANEL
AST: 13 U/L (ref 0–37)
Albumin: 4.6 g/dL (ref 3.5–5.2)
Alkaline Phosphatase: 58 U/L (ref 39–117)
Potassium: 4.4 mEq/L (ref 3.5–5.3)
Sodium: 136 mEq/L (ref 135–145)
Total Bilirubin: 0.4 mg/dL (ref 0.3–1.2)
Total Protein: 7.6 g/dL (ref 6.0–8.3)

## 2012-02-24 LAB — CBC WITH DIFFERENTIAL/PLATELET
Basophils Absolute: 0 10*3/uL (ref 0.0–0.1)
Basophils Relative: 0 % (ref 0–1)
Eosinophils Absolute: 0.1 10*3/uL (ref 0.0–0.7)
Hemoglobin: 13.8 g/dL (ref 12.0–15.0)
MCHC: 31.9 g/dL (ref 30.0–36.0)
Neutro Abs: 5.3 10*3/uL (ref 1.7–7.7)
Neutrophils Relative %: 59 % (ref 43–77)
Platelets: 300 10*3/uL (ref 150–400)
RDW: 13.3 % (ref 11.5–15.5)

## 2012-02-24 LAB — PROLACTIN: Prolactin: 17.4 ng/mL

## 2012-02-24 MED ORDER — NORETHINDRONE-ETH ESTRADIOL 1-35 MG-MCG PO TABS: 1.0000 | ORAL_TABLET | Freq: Every day | ORAL | Status: DC

## 2012-02-24 NOTE — Progress Notes (Signed)
History Supposed to be here for IUD insertion.  She has seen IUD commercials and does not want it now.  All options were discussed with the patient.   Physical exam Filed Vitals:   02/24/12 1018  BP: 115/70  Pulse: 73   Gen-WD/WN female in NAD Abd-soft, NT  Assessment/Plan: 1. Contraception management  POCT urine pregnancy, norethindrone-ethinyl estradiol 1/35 (NECON 1/35, 28,) tablet  2. Weight gain  TSH, Prolactin, Testosterone, free, total, Comp Met (CMET)  3. Screening for cholesterol level  Lipid Profile  4. Screening for other and unspecified deficiency anemia  CBC w/Diff

## 2012-02-24 NOTE — Patient Instructions (Signed)

## 2012-02-25 LAB — TESTOSTERONE, FREE, TOTAL, SHBG
Sex Hormone Binding: 27 nmol/L (ref 18–114)
Testosterone, Free: 6 pg/mL (ref 0.6–6.8)
Testosterone-% Free: 2 % (ref 0.4–2.4)
Testosterone: 30.04 ng/dL (ref 10–70)

## 2012-08-16 ENCOUNTER — Other Ambulatory Visit: Payer: Self-pay | Admitting: Gynecology

## 2012-08-16 DIAGNOSIS — Z309 Encounter for contraceptive management, unspecified: Secondary | ICD-10-CM

## 2012-08-23 ENCOUNTER — Telehealth: Payer: Self-pay | Admitting: Gynecology

## 2012-08-23 DIAGNOSIS — Z309 Encounter for contraceptive management, unspecified: Secondary | ICD-10-CM

## 2012-08-23 MED ORDER — NORGESTIM-ETH ESTRAD TRIPHASIC 0.18/0.215/0.25 MG-25 MCG PO TABS: 1.0000 | ORAL_TABLET | Freq: Every day | ORAL | Status: DC

## 2012-08-23 NOTE — Telephone Encounter (Signed)
Patient call regarding changing her BCP. Per patient c/o of swelling in feet and would like another script sent [facility] her [facility].

## 2012-10-27 ENCOUNTER — Ambulatory Visit (INDEPENDENT_AMBULATORY_CARE_PROVIDER_SITE_OTHER): Payer: 59 | Admitting: Family Medicine

## 2012-10-27 ENCOUNTER — Encounter: Payer: Self-pay | Admitting: Family Medicine

## 2012-10-27 VITALS — BP 110/68 | Wt 225.0 lb

## 2012-10-27 DIAGNOSIS — O2620 Pregnancy care for patient with recurrent pregnancy loss, unspecified trimester: Secondary | ICD-10-CM | POA: Insufficient documentation

## 2012-10-27 DIAGNOSIS — Z3201 Encounter for pregnancy test, result positive: Secondary | ICD-10-CM

## 2012-10-27 DIAGNOSIS — Z3480 Encounter for supervision of other normal pregnancy, unspecified trimester: Secondary | ICD-10-CM | POA: Insufficient documentation

## 2012-10-27 DIAGNOSIS — O09299 Supervision of pregnancy with other poor reproductive or obstetric history, unspecified trimester: Secondary | ICD-10-CM | POA: Insufficient documentation

## 2012-10-27 DIAGNOSIS — N912 Amenorrhea, unspecified: Secondary | ICD-10-CM

## 2012-10-27 LAB — POCT URINE PREGNANCY: Preg Test, Ur: POSITIVE

## 2012-10-27 MED ORDER — PRENATAL VITAMINS 0.8 MG PO TABS: 1.0000 | ORAL_TABLET | Freq: Every day | ORAL | Status: DC

## 2012-10-27 MED ORDER — ONDANSETRON HCL 4 MG PO TABS: 4.0000 mg | ORAL_TABLET | Freq: Three times a day (TID) | ORAL | Status: DC | PRN

## 2012-10-27 NOTE — Progress Notes (Signed)
.ob  Subjective:    Cheryl Martinez is a [id number] [redacted]w[redacted]d being seen today for her first obstetrical visit.  Her obstetrical history is significant for obesity and h/o abruptio, multiple SAB's and h/o shoulder dystocia with last pregnancy.. Patient does intend to breast feed. Pregnancy history fully reviewed.  Patient reports nausea and vomiting.  Filed Vitals:   10/27/12 1336  BP: 110/68  Weight: 225 lb (102.059 kg)    HISTORY: OB History    Grav Para Term Preterm Abortions TAB SAB Ect Mult Living   7 3 2 1 3  3   2      # Outc Date GA Lbr Len/2nd Wgt Sex Del Anes PTL Lv   1 TRM 1/01 [redacted]w[redacted]d  7lb2oz(3.232kg) F SVD   Yes   2 TRM 4/12 [redacted]w[redacted]d  8lb6oz(3.799kg) M SVD  No Yes   Comments: Shoulder dystocia   3 PRE  [redacted]w[redacted]d          Comments: abruption   4 SAB  [redacted]w[redacted]d          5 SAB  [redacted]w[redacted]d          6 SAB  [redacted]w[redacted]d          7 CUR              Past Medical History  Diagnosis Date  . Placental abruption   . History of abortion   . History of headache   . Depression   . Shoulder dystocia 03/26/11    DURING DELIVERY OF CHILD    Past Surgical History  Procedure Date  . Gallbladder surgery   . Knee surgery    Family History  Problem Relation Age of Onset  . Diabetes Maternal Grandmother   . Cancer Maternal Grandmother     BREAST     Exam    Uterus:     Pelvic Exam:    Perineum: Normal Perineum   Vulva: normal   Vagina:  normal mucosa       Cervix: multiparous appearance and no lesions   Adnexa: no mass, fullness, tenderness   Bony Pelvis: average  System: Breast:  normal appearance, no masses or tenderness   Skin: normal coloration and turgor, no rashes    Neurologic: oriented   Extremities: normal strength, tone, and muscle mass   HEENT sclera clear, anicteric   Mouth/Teeth mucous membranes moist, pharynx normal without lesions   Neck supple   Cardiovascular: regular rate and rhythm, no murmurs or gallops   Respiratory:  appears well, vitals normal, no respiratory distress,  acyanotic, normal RR, ear and throat exam is normal, neck free of mass or lymphadenopathy, chest clear, no wheezing, crepitations, rhonchi, normal symmetric air entry   Abdomen: soft, non-tender; bowel sounds normal; no masses,  no organomegaly       TVUS reveals 7 wk 4 d SIUP with fliker   Assessment:    Pregnancy: [id number] Patient Active Problem List  Diagnosis  . Depression  . Oligomenorrhea  . Panic attacks  . Supervision of other normal pregnancy  . History of recurrent abortion, currently pregnant  . Prior pregnancy with placenta abruption, antepartum  . H/O shoulder dystocia in prior pregnancy, currently pregnant        Plan:     Initial labs drawn. Prenatal vitamins. Problem list reviewed and updated. Genetic Screening discussed First Screen: ordered.  Ultrasound discussed; fetal survey: ordered.  Follow up in 4 weeks. Discussed Shoulder dystocia, risk of recurrence, possible need for primary C-section.  Possible long term sequelae.  Low sweets this pregnancy may make for a smaller baby.  She is undecided at present. Needs early 1hr.   Oasis Goehring S 10/27/2012

## 2012-10-27 NOTE — Patient Instructions (Signed)
Pregnancy - First Trimester During sexual intercourse, millions of sperm go into the vagina. Only 1 sperm will penetrate and fertilize the female egg while it is in the Fallopian tube. One week later, the fertilized egg implants into the wall of the uterus. An embryo begins to develop into a baby. At 6 to 8 weeks, the eyes and face are formed and the heartbeat can be seen on ultrasound. At the end of 12 weeks (first trimester), all the baby's organs are formed. Now that you are pregnant, you will want to do everything you can to have a healthy baby. Two of the most important things are to get good prenatal care and follow your caregiver's instructions. Prenatal care is all the medical care you receive before the baby's birth. It is given to prevent, find, and treat problems during the pregnancy and childbirth. PRENATAL EXAMS  During prenatal visits, your weight, blood pressure and urine are checked. This is done to make sure you are healthy and progressing normally during the pregnancy.  A pregnant woman should gain 25 to 35 pounds during the pregnancy. However, if you are over weight or underweight, your caregiver will advise you regarding your weight.  Your caregiver will ask and answer questions for you.  Blood work, cervical cultures, other necessary tests and a Pap test are done during your prenatal exams. These tests are done to check on your health and the probable health of your baby. Tests are strongly recommended and done for HIV with your permission. This is the virus that causes AIDS. These tests are done because medications can be given to help prevent your baby from being born with this infection should you have been infected without knowing it. Blood work is also used to find out your blood type, previous infections and follow your blood levels (hemoglobin).  Low hemoglobin (anemia) is common during pregnancy. Iron and vitamins are given to help prevent this. Later in the pregnancy,  blood tests for diabetes will be done along with any other tests if any problems develop. You may need tests to make sure you and the baby are doing well.  You may need other tests to make sure you and the baby are doing well. CHANGES DURING THE FIRST TRIMESTER (THE FIRST 3 MONTHS OF PREGNANCY) Your body goes through many changes during pregnancy. They vary from person to person. Talk to your caregiver about changes you notice and are concerned about. Changes can include:  Your menstrual period stops.  The egg and sperm carry the genes that determine what you look like. Genes from you and your partner are forming a baby. The female genes determine whether the baby is a boy or a girl.  Your body increases in girth and you may feel bloated.  Feeling sick to your stomach (nauseous) and throwing up (vomiting). If the vomiting is uncontrollable, call your caregiver.  Your breasts will begin to enlarge and become tender.  Your nipples may stick out more and become darker.  The need to urinate more. Painful urination may mean you have a bladder infection.  Tiring easily.  Loss of appetite.  Cravings for certain kinds of food.  At first, you may gain or lose a couple of pounds.  You may have changes in your emotions from day to day (excited to be pregnant or concerned something may go wrong with the pregnancy and baby).  You may have more vivid and strange dreams. HOME CARE INSTRUCTIONS   It is very important   to avoid all smoking, alcohol and un-prescribed drugs during your pregnancy. These affect the formation and growth of the baby. Avoid chemicals while pregnant to ensure the delivery of a healthy infant.  Start your prenatal visits by the 12th week of pregnancy. They are usually scheduled monthly at first, then more often in the last 2 months before delivery. Keep your caregiver's appointments. Follow your caregiver's instructions regarding medication use, blood and lab tests, exercise,  and diet.  During pregnancy, you are providing food for you and your baby. Eat regular, well-balanced meals. Choose foods such as meat, fish, milk and other low fat dairy products, vegetables, fruits, and whole-grain breads and cereals. Your caregiver will tell you of the ideal weight gain.  You can help morning sickness by keeping soda crackers at the bedside. Eat a couple before arising in the morning. You may want to use the crackers without salt on them.  Eating 4 to 5 small meals rather than 3 large meals a day also may help the nausea and vomiting.  Drinking liquids between meals instead of during meals also seems to help nausea and vomiting.  A physical sexual relationship may be continued throughout pregnancy if there are no other problems. Problems may be early (premature) leaking of amniotic fluid from the membranes, vaginal bleeding, or belly (abdominal) pain.  Exercise regularly if there are no restrictions. Check with your caregiver or physical therapist if you are unsure of the safety of some of your exercises. Greater weight gain will occur in the last 2 trimesters of pregnancy. Exercising will help:  Control your weight.  Keep you in shape.  Prepare you for labor and delivery.  Help you lose your pregnancy weight after you deliver your baby.  Wear a good support or jogging bra for breast tenderness during pregnancy. This may help if worn during sleep too.  Ask when prenatal classes are available. Begin classes when they are offered.  Do not use hot tubs, steam rooms or saunas.  Wear your seat belt when driving. This protects you and your baby if you are in an accident.  Avoid raw meat, uncooked cheese, cat litter boxes and soil used by cats throughout the pregnancy. These carry germs that can cause birth defects in the baby.  The first trimester is a good time to visit your dentist for your dental health. Getting your teeth cleaned is OK. Use a softer toothbrush and  brush gently during pregnancy.  Ask for help if you have financial, counseling or nutritional needs during pregnancy. Your caregiver will be able to offer counseling for these needs as well as refer you for other special needs.  Do not take any medications or herbs unless told by your caregiver.  Inform your caregiver if there is any mental or physical domestic violence.  Make a list of emergency phone numbers of family, friends, hospital, and police and fire departments.  Write down your questions. Take them to your prenatal visit.  Do not douche.  Do not cross your legs.  If you have to stand for long periods of time, rotate you feet or take small steps in a circle.  You may have more vaginal secretions that may require a sanitary pad. Do not use tampons or scented sanitary pads. MEDICATIONS AND DRUG USE IN PREGNANCY  Take prenatal vitamins as directed. The vitamin should contain 1 milligram of folic acid. Keep all vitamins out of reach of children. Only a couple vitamins or tablets containing iron may be   fatal to a baby or young child when ingested.  Avoid use of all medications, including herbs, over-the-counter medications, not prescribed or suggested by your caregiver. Only take over-the-counter or prescription medicines for pain, discomfort, or fever as directed by your caregiver. Do not use aspirin, ibuprofen, or naproxen unless directed by your caregiver.  Let your caregiver also know about herbs you may be using.  Alcohol is related to a number of birth defects. This includes fetal alcohol syndrome. All alcohol, in any form, should be avoided completely. Smoking will cause low birth rate and premature babies.  Street or illegal drugs are very harmful to the baby. They are absolutely forbidden. A baby born to an addicted mother will be addicted at birth. The baby will go through the same withdrawal an adult does.  Let your caregiver know about any medications that you have to  take and for what reason you take them. MISCARRIAGE IS COMMON DURING PREGNANCY A miscarriage does not mean you did something wrong. It is not a reason to worry about getting pregnant again. Your caregiver will help you with questions you may have. If you have a miscarriage, you may need minor surgery. SEEK MEDICAL CARE IF:  You have any concerns or worries during your pregnancy. It is better to call with your questions if you feel they cannot wait, rather than worry about them. SEEK IMMEDIATE MEDICAL CARE IF:   An unexplained oral temperature above 102 F (38.9 C) develops, or as your caregiver suggests.  You have leaking of fluid from the vagina (birth canal). If leaking membranes are suspected, take your temperature and inform your caregiver of this when you call.  There is vaginal spotting or bleeding. Notify your caregiver of the amount and how many pads are used.  You develop a bad smelling vaginal discharge with a change in the color.  You continue to feel sick to your stomach (nauseated) and have no relief from remedies suggested. You vomit blood or coffee ground-like materials.  You lose more than 2 pounds of weight in 1 week.  You gain more than 2 pounds of weight in 1 week and you notice swelling of your face, hands, feet, or legs.  You gain 5 pounds or more in 1 week (even if you do not have swelling of your hands, face, legs, or feet).  You get exposed to German measles and have never had them.  You are exposed to fifth disease or chickenpox.  You develop belly (abdominal) pain. Round ligament discomfort is a common non-cancerous (benign) cause of abdominal pain in pregnancy. Your caregiver still must evaluate this.  You develop headache, fever, diarrhea, pain with urination, or shortness of breath.  You fall or are in a car accident or have any kind of trauma.  There is mental or physical violence in your home. Document Released: 11/18/2001 Document Revised: 02/16/2012  Document Reviewed: 05/22/2009 [employer] Patient Information 2013 [employer].  Breastfeeding Deciding to breastfeed is one of the best choices you can make for you and your baby. The information that follows gives a brief overview of the benefits of breastfeeding as well as common topics surrounding breastfeeding. BENEFITS OF BREASTFEEDING For the baby  The first milk (colostrum) helps the baby's digestive system function better.   There are antibodies in the mother's milk that help the baby fight off infections.   The baby has a lower incidence of asthma, allergies, and sudden infant death syndrome (SIDS).   The nutrients in breast milk   are better for the baby than infant formulas, and breast milk helps the baby's brain grow better.   Babies who breastfeed have less gas, colic, and constipation.  For the mother  Breastfeeding helps develop a very special bond between the mother and her baby.   Breastfeeding is convenient, always available at the correct temperature, and costs nothing.   Breastfeeding burns calories in the mother and helps her lose weight that was gained during pregnancy.   Breastfeeding makes the uterus contract back down to normal size faster and slows bleeding following delivery.   Breastfeeding mothers have a lower risk of developing breast cancer.  BREASTFEEDING FREQUENCY  A healthy, full-term baby may breastfeed as often as every hour or space his or her feedings to every 3 hours.   Watch your baby for signs of hunger. Nurse your baby if he or she shows signs of hunger. How often you nurse will vary from baby to baby.   Nurse as often as the baby requests, or when you feel the need to reduce the fullness of your breasts.   Awaken the baby if it has been 3 4 hours since the last feeding.   Frequent feeding will help the mother make more milk and will help prevent problems, such as sore nipples and engorgement of the breasts.  BABY'S  POSITION AT THE BREAST  Whether lying down or sitting, be sure that the baby's tummy is facing your tummy.   Support the breast with 4 fingers underneath the breast and the thumb above. Make sure your fingers are well away from the nipple and baby's mouth.   Stroke the baby's lips gently with your finger or nipple.   When the baby's mouth is open wide enough, place all of your nipple and as much of the areola as possible into your baby's mouth.   Pull the baby in close so the tip of the nose and the baby's cheeks touch the breast during the feeding.  FEEDINGS AND SUCTION  The length of each feeding varies from baby to baby and from feeding to feeding.   The baby must suck about 2 3 minutes for your milk to get to him or her. This is called a "let down." For this reason, allow the baby to feed on each breast as long as he or she wants. Your baby will end the feeding when he or she has received the right balance of nutrients.   To break the suction, put your finger into the corner of the baby's mouth and slide it between his or her gums before removing your breast from his or her mouth. This will help prevent sore nipples.  HOW TO TELL WHETHER YOUR BABY IS GETTING ENOUGH BREAST MILK. Wondering whether or not your baby is getting enough milk is a common concern among mothers. You can be assured that your baby is getting enough milk if:   Your baby is actively sucking and you hear swallowing.   Your baby seems relaxed and satisfied after a feeding.   Your baby nurses at least 8 12 times in a 24 hour time period. Nurse your baby until he or she unlatches or falls asleep at the first breast (at least 10 20 minutes), then offer the second side.   Your baby is wetting 5 6 disposable diapers (6 8 cloth diapers) in a 24 hour period by 5 6 days of age.   Your baby is having at least 3 4 stools every 24 hours   for the first 6 weeks. The stool should be soft and yellow.   Your baby  should gain 4 7 ounces per week after he or she is 4 days old.   Your breasts feel softer after nursing.  REDUCING BREAST ENGORGEMENT  In the first week after your baby is born, you may experience signs of breast engorgement. When breasts are engorged, they feel heavy, warm, full, and may be tender to the touch. You can reduce engorgement if you:   Nurse frequently, every 2 3 hours. Mothers who breastfeed early and often have fewer problems with engorgement.   Place light ice packs on your breasts for 10 20 minutes between feedings. This reduces swelling. Wrap the ice packs in a lightweight towel to protect your skin. Bags of frozen vegetables work well for this purpose.   Take a warm shower or apply warm, moist heat to your breast for 5 10 minutes just before each feeding. This increases circulation and helps the milk flow.   Gently massage your breast before and during the feeding. Using your finger tips, massage from the chest wall towards your nipple in a circular motion.   Make sure that the baby empties at least one breast at every feeding before switching sides.   Use a breast pump to empty the breasts if your baby is sleepy or not nursing well. You may also want to pump if you are returning to work oryou feel you are getting engorged.   Avoid bottle feeds, pacifiers, or supplemental feedings of water or juice in place of breastfeeding. Breast milk is all the food your baby needs. It is not necessary for your baby to have water or formula. In fact, to help your breasts make more milk, it is best not to give your baby supplemental feedings during the early weeks.   Be sure the baby is latched on and positioned properly while breastfeeding.   Wear a supportive bra, avoiding underwire styles.   Eat a balanced diet with enough fluids.   Rest often, relax, and take your prenatal vitamins to prevent fatigue, stress, and anemia.  If you follow these suggestions, your  engorgement should improve in 24 48 hours. If you are still experiencing difficulty, call your lactation consultant or caregiver.  CARING FOR YOURSELF Take care of your breasts  Bathe or shower daily.   Avoid using soap on your nipples.   Start feedings on your left breast at one feeding and on your right breast at the next feeding.   You will notice an increase in your milk supply 2 5 days after delivery. You may feel some discomfort from engorgement, which makes your breasts very firm and often tender. Engorgement "peaks" out within 24 48 hours. In the meantime, apply warm moist towels to your breasts for 5 10 minutes before feeding. Gentle massage and expression of some milk before feeding will soften your breasts, making it easier for your baby to latch on.   Wear a well-fitting nursing bra, and air dry your nipples for a 3 4minutes after each feeding.   Only use cotton bra pads.   Only use pure lanolin on your nipples after nursing. You do not need to wash it off before feeding the baby again. Another option is to express a few drops of breast milk and gently massage it into your nipples.  Take care of yourself  Eat well-balanced meals and nutritious snacks.   Drinking milk, fruit juice, and water to satisfy your thirst (  about 8 glasses a day).   Get plenty of rest.  Avoid foods that you notice affect the baby in a bad way.  SEEK MEDICAL CARE IF:   You have difficulty with breastfeeding and need help.   You have a hard, red, sore area on your breast that is accompanied by a fever.   Your baby is too sleepy to eat well or is having trouble sleeping.   Your baby is wetting less than 6 diapers a day, by 5 days of age.   Your baby's skin or white part of his or her eyes is more yellow than it was in the hospital.   You feel depressed.  Document Released: 11/24/2005 Document Revised: 05/25/2012 Document Reviewed: 02/22/2012 [employer] Patient Information 2013  [employer]  

## 2012-11-22 ENCOUNTER — Encounter (HOSPITAL_COMMUNITY): Payer: Self-pay | Admitting: Family Medicine

## 2012-11-22 ENCOUNTER — Encounter: Payer: 59 | Admitting: Obstetrics & Gynecology

## 2012-11-23 ENCOUNTER — Encounter: Payer: 59 | Admitting: Family Medicine

## 2012-12-02 ENCOUNTER — Other Ambulatory Visit: Payer: Self-pay | Admitting: Family Medicine

## 2012-12-02 DIAGNOSIS — Z3682 Encounter for antenatal screening for nuchal translucency: Secondary | ICD-10-CM

## 2012-12-03 ENCOUNTER — Ambulatory Visit (HOSPITAL_COMMUNITY): Payer: Self-pay

## 2013-08-17 ENCOUNTER — Other Ambulatory Visit: Payer: Self-pay | Admitting: Family Medicine

## 2013-09-01 ENCOUNTER — Encounter (HOSPITAL_COMMUNITY): Payer: Self-pay | Admitting: *Deleted

## 2013-11-07 ENCOUNTER — Other Ambulatory Visit: Payer: Self-pay | Admitting: Internal Medicine

## 2013-11-07 ENCOUNTER — Other Ambulatory Visit (HOSPITAL_COMMUNITY)
Admission: RE | Admit: 2013-11-07 | Discharge: 2013-11-07 | Disposition: A | Discharge status: Home / Self Care | Payer: 59 | Source: Ambulatory Visit | Attending: Internal Medicine | Admitting: Internal Medicine

## 2013-11-07 DIAGNOSIS — Z01419 Encounter for gynecological examination (general) (routine) without abnormal findings: Secondary | ICD-10-CM | POA: Insufficient documentation

## 2013-11-29 ENCOUNTER — Encounter (HOSPITAL_COMMUNITY): Payer: Self-pay | Admitting: Emergency Medicine

## 2013-11-29 ENCOUNTER — Emergency Department (HOSPITAL_COMMUNITY)
Admission: EM | Admit: 2013-11-29 | Discharge: 2013-11-29 | Disposition: A | Discharge status: Home / Self Care | Payer: 59 | Source: Home / Self Care | Attending: Family Medicine | Admitting: Family Medicine

## 2013-11-29 DIAGNOSIS — R071 Chest pain on breathing: Secondary | ICD-10-CM

## 2013-11-29 DIAGNOSIS — R0789 Other chest pain: Secondary | ICD-10-CM

## 2013-11-29 MED ORDER — DICLOFENAC POTASSIUM 50 MG PO TABS: 50.0000 mg | ORAL_TABLET | Freq: Three times a day (TID) | ORAL | Status: DC

## 2013-11-29 NOTE — ED Provider Notes (Signed)
CSN: [id number]     Arrival date & time 11/29/13  1848 History   First MD Initiated Contact with Patient 11/29/13 1910     Chief Complaint  Patient presents with  . Chest Pain    burning in chest   (Consider location/radiation/quality/duration/timing/severity/associated sxs/prior Treatment) Patient is a 37 y.o. female presenting with chest pain.  Chest Pain Pain location:  L lateral chest Pain quality: burning   Pain radiates to:  Does not radiate Pain radiates to the back: no   Pain severity:  Mild Onset quality:  Gradual Duration:  1 week Timing:  Intermittent Progression:  Waxing and waning Chronicity:  New Relieved by:  None tried Worsened by:  Nothing tried Associated symptoms: anxiety   Associated symptoms: no abdominal pain, no anorexia, no back pain, no cough, no dizziness, no fever, no orthopnea, no palpitations and no shortness of breath     Past Medical History  Diagnosis Date  . Placental abruption   . History of abortion   . History of headache   . Depression   . Shoulder dystocia 03/26/11    DURING DELIVERY OF CHILD    Past Surgical History  Procedure Laterality Date  . Gallbladder surgery    . Knee surgery     Family History  Problem Relation Age of Onset  . Diabetes Maternal Grandmother   . Cancer Maternal Grandmother     BREAST   History  Substance Use Topics  . Smoking status: Never Smoker   . Smokeless tobacco: Not on file  . Alcohol Use: No   OB History   Grav Para Term Preterm Abortions TAB SAB Ect Mult Living   7 3 2 1 3  3   2      Review of Systems  Constitutional: Negative.  Negative for fever.  HENT: Negative.   Respiratory: Negative for cough and shortness of breath.   Cardiovascular: Positive for chest pain. Negative for palpitations, orthopnea and leg swelling.  Gastrointestinal: Negative for abdominal pain and anorexia.  Musculoskeletal: Negative for back pain.  Neurological: Negative for dizziness.    Allergies   Penicillins  Home Medications   Current Outpatient Rx  Name  Route  Sig  Dispense  Refill  . ORTHO TRI-CYCLEN LO 0.18/0.215/0.25 MG-25 MCG tab      TAKE ONE TABLET BY MOUTH EVERY DAY   1 Package   10   . diclofenac (CATAFLAM) 50 MG tablet   Oral   Take 1 tablet (50 mg total) by mouth 3 (three) times daily. For chest pains   30 tablet   0   . ondansetron (ZOFRAN) 4 MG tablet   Oral   Take 1 tablet (4 mg total) by mouth every 8 (eight) hours as needed for nausea.   42 tablet   2   . Prenatal Multivit-Min-Fe-FA (PRENATAL VITAMINS) 0.8 MG tablet   Oral   Take 1 tablet by mouth daily.   30 tablet   12    BP 128/75  Pulse 83  Temp(Src) 97.8 F (36.6 C) (Oral)  SpO2 100%  LMP 10/30/2013  Breastfeeding? No Physical Exam  Nursing note and vitals reviewed. Constitutional: She is oriented to person, place, and time. She appears well-developed and well-nourished. No distress.  HENT:  Right Ear: External ear normal.  Mouth/Throat: Oropharynx is clear and moist.  Eyes: Conjunctivae are normal. Pupils are equal, round, and reactive to light.  Neck: Normal range of motion. Neck supple.  Cardiovascular: Normal rate, regular rhythm, normal  heart sounds and intact distal pulses.   Pulmonary/Chest: Effort normal and breath sounds normal. She exhibits tenderness.  Abdominal: Soft. Bowel sounds are normal. There is no tenderness.  Lymphadenopathy:    She has no cervical adenopathy.  Neurological: She is alert and oriented to person, place, and time.  Skin: Skin is warm and dry.    ED Course  Procedures (including critical care time) Labs Review Labs Reviewed - No data to display Imaging Review No results found.  EKG Interpretation    Date/Time:    Ventricular Rate:    PR Interval:    QRS Duration:   QT Interval:    QTC Calculation:   R Axis:     Text Interpretation:              MDM      Linna Hoff, MD 11/29/13 1929

## 2013-11-29 NOTE — ED Notes (Signed)
Patient triaged and assessed by provider.  Provider in before nurse.  

## 2014-02-05 DIAGNOSIS — M1712 Unilateral primary osteoarthritis, left knee: Secondary | ICD-10-CM

## 2014-02-05 DIAGNOSIS — M2242 Chondromalacia patellae, left knee: Secondary | ICD-10-CM

## 2014-02-05 DIAGNOSIS — M6752 Plica syndrome, left knee: Secondary | ICD-10-CM

## 2014-02-05 DIAGNOSIS — S83106A Unspecified dislocation of unspecified knee, initial encounter: Secondary | ICD-10-CM

## 2014-02-05 HISTORY — DX: Unilateral primary osteoarthritis, left knee: M17.12

## 2014-02-05 HISTORY — DX: Chondromalacia patellae, left knee: M22.42

## 2014-02-05 HISTORY — DX: Unspecified dislocation of unspecified knee, initial encounter: S83.106A

## 2014-02-05 HISTORY — DX: Plica syndrome, left knee: M67.52

## 2014-02-13 ENCOUNTER — Encounter (HOSPITAL_BASED_OUTPATIENT_CLINIC_OR_DEPARTMENT_OTHER): Payer: Self-pay | Admitting: *Deleted

## 2014-02-14 ENCOUNTER — Other Ambulatory Visit: Payer: Self-pay | Admitting: Physician Assistant

## 2014-02-14 NOTE — H&P (Signed)
Cheryl Martinez is an 38 y.o. female.   Chief Complaint: left knee patellar dislocation lateral meniscus tear HPI: Patient seen and evaluated in outpatient clinic distant history of patellar dislocation following a fall down some stairs represented to our office with continued left knee complaints.  MRI showed findings consistent with dislocation as well as a lateral meniscus tear.  Failed conservative treatments to this point wanting to proceed with surgery.  Past Medical History  Diagnosis Date  . Hypothyroidism   . Knee dislocation 02/2014    left  . Degenerative arthritis of left knee 02/2014  . 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 Gallbladder surgery  1997  . Knee surgery Right 1994    Family History  Problem Relation Age of Onset  . Diabetes Maternal Grandmother   . Cancer Maternal Grandmother     BREAST   Social History:  reports that she has never smoked. She has never used smokeless tobacco. She reports that she drinks alcohol. She reports that she does not use illicit drugs.  Allergies:  Allergies  Allergen Reactions  . Penicillins Itching     (Not in a hospital admission)  No results found for this or any previous visit (from the past 48 hour(s)). No results found.  Review of Systems  Constitutional: Negative.   HENT: Negative.   Eyes: Negative.   Respiratory: Negative.   Cardiovascular: Negative.   Gastrointestinal: Negative.   Genitourinary: Negative.   Musculoskeletal: Positive for falls and joint pain.  Skin: Negative.   Neurological: Negative.   Endo/Heme/Allergies: Negative.   Psychiatric/Behavioral: Negative.     Last menstrual period 02/07/2014. Physical Exam  Constitutional: She is oriented to person, place, and time. She appears well-developed and well-nourished. No distress.  HENT:  Head: Normocephalic and atraumatic.  Nose: Nose normal.  Eyes:  Conjunctivae and EOM are normal. Pupils are equal, round, and reactive to light.  Neck: Normal range of motion. Neck supple.  Cardiovascular: Normal rate, regular rhythm and intact distal pulses.   Respiratory: Effort normal. No respiratory distress.  GI: Soft. She exhibits no distension. There is no tenderness.  Musculoskeletal:       Left knee: She exhibits swelling, abnormal patellar mobility and bony tenderness. She exhibits no effusion, no ecchymosis, no deformity, no laceration, no erythema, no LCL laxity and no MCL laxity. Tenderness found. Lateral joint line tenderness noted.  Neurological: She is alert and oriented to person, place, and time.  Skin: Skin is warm and dry. No rash noted. No erythema.  Psychiatric: She has a normal mood and affect. Her behavior is normal.     Assessment/Plan left knee patellar dislocation lateral meniscus tear  Discussed risks and benefits of left knee arthroscopy for lateral menisectomy, debridement, possible lateral release, open patellar realignment patient wishes to proceed.  This will be done as an outpatient procedure.  General anesthesia.    Margart SicklesChadwell, Abbigale Mcelhaney 02/14/2014, 1:19 PM

## 2014-02-17 ENCOUNTER — Encounter (HOSPITAL_BASED_OUTPATIENT_CLINIC_OR_DEPARTMENT_OTHER): Payer: 59 | Admitting: Certified Registered"

## 2014-02-17 ENCOUNTER — Ambulatory Visit (HOSPITAL_BASED_OUTPATIENT_CLINIC_OR_DEPARTMENT_OTHER): Payer: 59 | Admitting: Certified Registered"

## 2014-02-17 ENCOUNTER — Ambulatory Visit (HOSPITAL_BASED_OUTPATIENT_CLINIC_OR_DEPARTMENT_OTHER)
Admission: RE | Admit: 2014-02-17 | Discharge: 2014-02-17 | Disposition: A | Discharge status: Home / Self Care | Payer: 59 | Source: Ambulatory Visit | Attending: Orthopedic Surgery | Admitting: Orthopedic Surgery

## 2014-02-17 ENCOUNTER — Encounter (HOSPITAL_BASED_OUTPATIENT_CLINIC_OR_DEPARTMENT_OTHER): Payer: Self-pay | Admitting: Certified Registered"

## 2014-02-17 ENCOUNTER — Encounter (HOSPITAL_BASED_OUTPATIENT_CLINIC_OR_DEPARTMENT_OTHER)
Admission: RE | Disposition: A | Discharge status: Home / Self Care | Payer: Self-pay | Source: Ambulatory Visit | Attending: Orthopedic Surgery

## 2014-02-17 DIAGNOSIS — M24469 Recurrent dislocation, unspecified knee: Secondary | ICD-10-CM | POA: Insufficient documentation

## 2014-02-17 DIAGNOSIS — IMO0002 Reserved for concepts with insufficient information to code with codable children: Secondary | ICD-10-CM | POA: Insufficient documentation

## 2014-02-17 DIAGNOSIS — M2240 Chondromalacia patellae, unspecified knee: Secondary | ICD-10-CM | POA: Insufficient documentation

## 2014-02-17 DIAGNOSIS — R443 Hallucinations, unspecified: Secondary | ICD-10-CM | POA: Insufficient documentation

## 2014-02-17 DIAGNOSIS — F3289 Other specified depressive episodes: Secondary | ICD-10-CM | POA: Insufficient documentation

## 2014-02-17 DIAGNOSIS — M1710 Unilateral primary osteoarthritis, unspecified knee: Secondary | ICD-10-CM | POA: Insufficient documentation

## 2014-02-17 DIAGNOSIS — F411 Generalized anxiety disorder: Secondary | ICD-10-CM | POA: Insufficient documentation

## 2014-02-17 DIAGNOSIS — F329 Major depressive disorder, single episode, unspecified: Secondary | ICD-10-CM | POA: Insufficient documentation

## 2014-02-17 DIAGNOSIS — M6750 Plica syndrome, unspecified knee: Secondary | ICD-10-CM | POA: Insufficient documentation

## 2014-02-17 DIAGNOSIS — M2340 Loose body in knee, unspecified knee: Secondary | ICD-10-CM | POA: Insufficient documentation

## 2014-02-17 DIAGNOSIS — E039 Hypothyroidism, unspecified: Secondary | ICD-10-CM | POA: Insufficient documentation

## 2014-02-17 HISTORY — DX: Plica syndrome, left knee: M67.52

## 2014-02-17 HISTORY — DX: Unilateral primary osteoarthritis, left knee: M17.12

## 2014-02-17 HISTORY — DX: Chondromalacia patellae, left knee: M22.42

## 2014-02-17 HISTORY — DX: Hypothyroidism, unspecified: E03.9

## 2014-02-17 HISTORY — DX: Unspecified dislocation of unspecified knee, initial encounter: S83.106A

## 2014-02-17 HISTORY — PX: KNEE ARTHROSCOPY WITH PATELLA RECONSTRUCTION: SHX5654

## 2014-02-17 LAB — POCT HEMOGLOBIN-HEMACUE: Hemoglobin: 14 g/dL (ref 12.0–15.0)

## 2014-02-17 SURGERY — Surgical Case
Anesthesia: *Unknown

## 2014-02-17 SURGERY — ARTHROSCOPY, KNEE, WITH PATELLAR RECONSTRUCTION
Anesthesia: General | Site: Knee | Laterality: Left

## 2014-02-17 MED ORDER — FENTANYL CITRATE 0.05 MG/ML IJ SOLN
50.0000 ug | INTRAMUSCULAR | Status: DC | PRN
  Administered 2014-02-17: 100 ug via INTRAVENOUS

## 2014-02-17 MED ORDER — METHYLPREDNISOLONE ACETATE 80 MG/ML IJ SUSP
INTRAMUSCULAR | Status: AC
  Filled 2014-02-17: qty 1

## 2014-02-17 MED ORDER — CLINDAMYCIN PHOSPHATE 900 MG/50ML IV SOLN
INTRAVENOUS | Status: AC
  Filled 2014-02-17: qty 50

## 2014-02-17 MED ORDER — SODIUM CHLORIDE 0.9 % IV SOLN: INTRAVENOUS | Status: DC

## 2014-02-17 MED ORDER — CLINDAMYCIN PHOSPHATE 900 MG/50ML IV SOLN
900.0000 mg | INTRAVENOUS | Status: AC
  Administered 2014-02-17: 900 mg via INTRAVENOUS

## 2014-02-17 MED ORDER — LIDOCAINE HCL (CARDIAC) 20 MG/ML IV SOLN
INTRAVENOUS | Status: DC | PRN
  Administered 2014-02-17: 60 mg via INTRAVENOUS

## 2014-02-17 MED ORDER — FENTANYL CITRATE 0.05 MG/ML IJ SOLN
INTRAMUSCULAR | Status: AC
  Filled 2014-02-17: qty 2

## 2014-02-17 MED ORDER — OXYCODONE HCL 5 MG PO TABS: 5.0000 mg | ORAL_TABLET | Freq: Once | ORAL | Status: DC | PRN

## 2014-02-17 MED ORDER — ROPIVACAINE HCL 5 MG/ML IJ SOLN
INTRAMUSCULAR | Status: DC | PRN
  Administered 2014-02-17: 30 mL

## 2014-02-17 MED ORDER — MIDAZOLAM HCL 2 MG/2ML IJ SOLN
INTRAMUSCULAR | Status: AC
  Filled 2014-02-17: qty 2

## 2014-02-17 MED ORDER — BUPIVACAINE-EPINEPHRINE PF 0.5-1:200000 % IJ SOLN
INTRAMUSCULAR | Status: AC
  Filled 2014-02-17: qty 30

## 2014-02-17 MED ORDER — LACTATED RINGERS IV SOLN
INTRAVENOUS | Status: DC
  Administered 2014-02-17 (×2): via INTRAVENOUS

## 2014-02-17 MED ORDER — DEXAMETHASONE SODIUM PHOSPHATE 10 MG/ML IJ SOLN
INTRAMUSCULAR | Status: DC | PRN
  Administered 2014-02-17: 10 mg via INTRAVENOUS

## 2014-02-17 MED ORDER — BUPIVACAINE-EPINEPHRINE 0.5% -1:200000 IJ SOLN
INTRAMUSCULAR | Status: DC | PRN
  Administered 2014-02-17: 15 mL

## 2014-02-17 MED ORDER — HYDROMORPHONE HCL PF 1 MG/ML IJ SOLN
INTRAMUSCULAR | Status: AC
Start: 2014-02-17 — End: 2014-02-17
  Filled 2014-02-17: qty 1

## 2014-02-17 MED ORDER — OXYCODONE HCL 5 MG/5ML PO SOLN: 5.0000 mg | Freq: Once | ORAL | Status: DC | PRN

## 2014-02-17 MED ORDER — METHYLPREDNISOLONE ACETATE 40 MG/ML IJ SUSP
INTRAMUSCULAR | Status: DC | PRN
Start: 2014-02-17 — End: 2014-02-17
  Administered 2014-02-17: 40 mg

## 2014-02-17 MED ORDER — HYDROMORPHONE HCL PF 1 MG/ML IJ SOLN
0.2500 mg | INTRAMUSCULAR | Status: DC | PRN
  Administered 2014-02-17 (×3): 0.5 mg via INTRAVENOUS

## 2014-02-17 MED ORDER — PROPOFOL 10 MG/ML IV EMUL
INTRAVENOUS | Status: AC
  Filled 2014-02-17: qty 100

## 2014-02-17 MED ORDER — EPINEPHRINE HCL 1 MG/ML IJ SOLN
INTRAMUSCULAR | Status: AC
  Filled 2014-02-17: qty 1

## 2014-02-17 MED ORDER — PROMETHAZINE HCL 25 MG/ML IJ SOLN: 6.2500 mg | INTRAMUSCULAR | Status: DC | PRN

## 2014-02-17 MED ORDER — METHYLPREDNISOLONE ACETATE 40 MG/ML IJ SUSP
INTRAMUSCULAR | Status: AC
  Filled 2014-02-17: qty 1

## 2014-02-17 MED ORDER — FENTANYL CITRATE 0.05 MG/ML IJ SOLN
INTRAMUSCULAR | Status: AC
  Filled 2014-02-17: qty 8

## 2014-02-17 MED ORDER — HYDROMORPHONE HCL PF 1 MG/ML IJ SOLN
INTRAMUSCULAR | Status: AC
  Filled 2014-02-17: qty 1

## 2014-02-17 MED ORDER — PROPOFOL 10 MG/ML IV BOLUS
INTRAVENOUS | Status: DC | PRN
  Administered 2014-02-17: 200 mg via INTRAVENOUS

## 2014-02-17 MED ORDER — SODIUM CHLORIDE 0.9 % IR SOLN
Status: DC | PRN
  Administered 2014-02-17: 6000 mL

## 2014-02-17 MED ORDER — MIDAZOLAM HCL 2 MG/ML PO SYRP: 12.0000 mg | ORAL_SOLUTION | Freq: Once | ORAL | Status: DC | PRN

## 2014-02-17 MED ORDER — EPINEPHRINE HCL 1 MG/ML IJ SOLN
INTRAMUSCULAR | Status: DC | PRN
  Administered 2014-02-17: 1 mg

## 2014-02-17 MED ORDER — ONDANSETRON HCL 4 MG/2ML IJ SOLN
INTRAMUSCULAR | Status: DC | PRN
  Administered 2014-02-17: 4 mg via INTRAVENOUS

## 2014-02-17 MED ORDER — MIDAZOLAM HCL 2 MG/2ML IJ SOLN
1.0000 mg | INTRAMUSCULAR | Status: DC | PRN
  Administered 2014-02-17: 2 mg via INTRAVENOUS

## 2014-02-17 MED ORDER — FENTANYL CITRATE 0.05 MG/ML IJ SOLN
INTRAMUSCULAR | Status: DC | PRN
  Administered 2014-02-17: 25 ug via INTRAVENOUS
  Administered 2014-02-17: 50 ug via INTRAVENOUS
  Administered 2014-02-17: 25 ug via INTRAVENOUS

## 2014-02-17 MED ORDER — MIDAZOLAM HCL 5 MG/5ML IJ SOLN
INTRAMUSCULAR | Status: DC | PRN
  Administered 2014-02-17: 2 mg via INTRAVENOUS

## 2014-02-17 MED ORDER — CHLORHEXIDINE GLUCONATE 4 % EX LIQD: 60.0000 mL | Freq: Once | CUTANEOUS | Status: DC

## 2014-02-17 SURGICAL SUPPLY — 71 items
BANDAGE ELASTIC 6 VELCRO ST LF (GAUZE/BANDAGES/DRESSINGS) IMPLANT
BANDAGE ESMARK 6X9 LF (GAUZE/BANDAGES/DRESSINGS) IMPLANT
BENZOIN TINCTURE PRP APPL 2/3 (GAUZE/BANDAGES/DRESSINGS) IMPLANT
BLADE 4.2CUDA (BLADE) ×2 IMPLANT
BLADE CUDA 5.5 (BLADE) IMPLANT
BLADE CUDA GRT WHITE 3.5 (BLADE) IMPLANT
BLADE CUDA SHAVER 3.5 (BLADE) IMPLANT
BLADE CUTTER MENIS 5.5 (BLADE) IMPLANT
BLADE GREAT WHITE 4.2 (BLADE) IMPLANT
BLADE SURG 15 STRL LF DISP TIS (BLADE) ×1 IMPLANT
BLADE SURG 15 STRL SS (BLADE) ×1
BNDG ESMARK 6X9 LF (GAUZE/BANDAGES/DRESSINGS)
BNDG GAUZE ELAST 4 BULKY (GAUZE/BANDAGES/DRESSINGS) ×2 IMPLANT
BRUSH SCRUB EZ PLAIN DRY (MISCELLANEOUS) IMPLANT
CANISTER SUCT 3000ML (MISCELLANEOUS) IMPLANT
CUFF TOURNIQUET SINGLE 34IN LL (TOURNIQUET CUFF) ×2 IMPLANT
CUTTER MENISCUS  4.2MM (BLADE)
CUTTER MENISCUS 4.2MM (BLADE) IMPLANT
DRAPE ARTHROSCOPY W/POUCH 114 (DRAPES) ×2 IMPLANT
DRAPE U-SHAPE 47X51 STRL (DRAPES) IMPLANT
DRSG EMULSION OIL 3X3 NADH (GAUZE/BANDAGES/DRESSINGS) ×2 IMPLANT
DURAPREP 26ML APPLICATOR (WOUND CARE) ×4 IMPLANT
ELECT REM PT RETURN 9FT ADLT (ELECTROSURGICAL) ×2
ELECTRODE REM PT RTRN 9FT ADLT (ELECTROSURGICAL) ×1 IMPLANT
GLOVE BIO SURGEON STRL SZ 6.5 (GLOVE) ×2 IMPLANT
GLOVE BIO SURGEON STRL SZ7.5 (GLOVE) ×2 IMPLANT
GLOVE BIOGEL PI IND STRL 7.0 (GLOVE) ×1 IMPLANT
GLOVE BIOGEL PI IND STRL 8 (GLOVE) ×2 IMPLANT
GLOVE BIOGEL PI INDICATOR 7.0 (GLOVE) ×1
GLOVE BIOGEL PI INDICATOR 8 (GLOVE) ×2
GLOVE EXAM NITRILE MD LF STRL (GLOVE) ×2 IMPLANT
GLOVE SURG ORTHO 8.0 STRL STRW (GLOVE) ×2 IMPLANT
GOWN STRL REUS W/ TWL LRG LVL3 (GOWN DISPOSABLE) ×1 IMPLANT
GOWN STRL REUS W/ TWL XL LVL3 (GOWN DISPOSABLE) ×1 IMPLANT
GOWN STRL REUS W/TWL LRG LVL3 (GOWN DISPOSABLE) ×1
GOWN STRL REUS W/TWL XL LVL3 (GOWN DISPOSABLE) ×1
HOLDER KNEE FOAM BLUE (MISCELLANEOUS) IMPLANT
IMMOBILIZER KNEE 22 UNIV (SOFTGOODS) IMPLANT
IMMOBILIZER KNEE 24 THIGH 36 (MISCELLANEOUS) IMPLANT
IMMOBILIZER KNEE 24 UNIV (MISCELLANEOUS)
KNEE WRAP E Z 3 GEL PACK (MISCELLANEOUS) IMPLANT
MANIFOLD NEPTUNE II (INSTRUMENTS) IMPLANT
NDL SAFETY ECLIPSE 18X1.5 (NEEDLE) ×2 IMPLANT
NEEDLE HYPO 18GX1.5 SHARP (NEEDLE) ×2
PACK ARTHROSCOPY DSU (CUSTOM PROCEDURE TRAY) ×2 IMPLANT
PACK BASIN DAY SURGERY FS (CUSTOM PROCEDURE TRAY) ×2 IMPLANT
PENCIL BUTTON HOLSTER BLD 10FT (ELECTRODE) ×2 IMPLANT
SET ARTHROSCOPY TUBING (MISCELLANEOUS) ×1
SET ARTHROSCOPY TUBING LN (MISCELLANEOUS) ×1 IMPLANT
SLEEVE SCD COMPRESS KNEE MED (MISCELLANEOUS) ×2 IMPLANT
SPONGE GAUZE 4X4 12PLY (GAUZE/BANDAGES/DRESSINGS) ×2 IMPLANT
SPONGE LAP 4X18 X RAY DECT (DISPOSABLE) ×2 IMPLANT
STRIP CLOSURE SKIN 1/2X4 (GAUZE/BANDAGES/DRESSINGS) IMPLANT
SUCTION FRAZIER TIP 10 FR DISP (SUCTIONS) ×2 IMPLANT
SUT ETHILON 4 0 PS 2 18 (SUTURE) ×2 IMPLANT
SUT FIBERWIRE #2 38 T-5 BLUE (SUTURE)
SUT MNCRL AB 3-0 PS2 18 (SUTURE) IMPLANT
SUT VIC AB 0 CT1 27 (SUTURE)
SUT VIC AB 0 CT1 27XBRD ANBCTR (SUTURE) IMPLANT
SUT VIC AB 1 CT1 27 (SUTURE)
SUT VIC AB 1 CT1 27XBRD ANBCTR (SUTURE) IMPLANT
SUT VIC AB 2-0 SH 18 (SUTURE) IMPLANT
SUTURE FIBERWR #2 38 T-5 BLUE (SUTURE) IMPLANT
SYR 5ML LL (SYRINGE) ×2 IMPLANT
SYR CONTROL 10ML LL (SYRINGE) ×2 IMPLANT
TOWEL OR 17X24 6PK STRL BLUE (TOWEL DISPOSABLE) ×2 IMPLANT
WAND 3.0 CAPSURE 30 DEG W/CORD (SURGICAL WAND) IMPLANT
WAND 30 DEG SABER W/CORD (SURGICAL WAND) ×4 IMPLANT
WAND STAR VAC 90 (SURGICAL WAND) IMPLANT
WATER STERILE IRR 1000ML POUR (IV SOLUTION) ×2 IMPLANT
YANKAUER SUCT BULB TIP NO VENT (SUCTIONS) IMPLANT

## 2014-02-17 NOTE — Discharge Instructions (Signed)
Diet: As you were doing prior to hospitalization  Activity:  Increase activity slowly as tolerated                  No lifting or driving for 2 weeks  Shower:  May shower without a dressing starting Sunday, NO SOAKING in tub  Dressing:  You may change your dressing on Sunday, band aids over incisions following shower  Weight Bearing:   weight bearing as tolerated.  Use a walker or                    Crutches as needed.  To prevent constipation: you may use a stool softener such as -               Colace ( over the counter) 100 mg by mouth twice a day                Drink plenty of fluids ( prune juice may be helpful) and high fiber foods                Miralax ( over the counter) for constipation as needed.    Precautions:  If you experience chest pain or shortness of breath - call 911 immediately               For transfer to the hospital emergency department!!               If you develop a fever greater that 101 F, purulent drainage from wound,                             increased redness or drainage from wound, or calf pain -- Call the office   Follow- Up Appointment:  Please call for an appointment to be seen in 1 week               [place] - [phone]    Post Anesthesia Home Care Instructions  Activity: Get plenty of rest for the remainder of the day. A responsible adult should stay with you for 24 hours following the procedure.  For the next 24 hours, DO NOT: -Drive a car -Advertising copywriter -Drink alcoholic beverages -Take any medication unless instructed by your physician -Make any legal decisions or sign important papers.  Meals: Start with liquid foods such as gelatin or soup. Progress to regular foods as tolerated. Avoid greasy, spicy, heavy foods. If nausea and/or vomiting occur, drink only clear liquids until the nausea and/or vomiting subsides. Call your physician if vomiting continues.  Special Instructions/Symptoms: Your throat may feel dry or sore from  the anesthesia or the breathing tube placed in your throat during surgery. If this causes discomfort, gargle with warm salt water. The discomfort should disappear within 24 hours.   Regional Anesthesia Blocks  1. Numbness or the inability to move the "blocked" extremity may last from 3-48 hours after placement. The length of time depends on the medication injected and your individual response to the medication. If the numbness is not going away after 48 hours, call your surgeon.  2. The extremity that is blocked will need to be protected until the numbness is gone and the  Strength has returned. Because you cannot feel it, you will need to take extra care to avoid injury. Because it may be weak, you may have difficulty moving it or using it. You may not know what position it is in  without looking at it while the block is in effect.  3. For blocks in the legs and feet, returning to weight bearing and walking needs to be done carefully. You will need to wait until the numbness is entirely gone and the strength has returned. You should be able to move your leg and foot normally before you try and bear weight or walk. You will need someone to be with you when you first try to ensure you do not fall and possibly risk injury.  4. Bruising and tenderness at the needle site are common side effects and will resolve in a few days.  5. Persistent numbness or new problems with movement should be communicated to the surgeon or the [facility] [phone])/ [facility] [phone]).

## 2014-02-17 NOTE — Brief Op Note (Signed)
02/17/2014  9:48 AM  PATIENT:  Cheryl Martinez  38 y.o. female  PRE-OPERATIVE DIAGNOSIS:  Left Knee disloaction/subluxation patella/knee - closed, Degenerative Arthritis, Chondromalcia patella; PLICA  POST-OPERATIVE DIAGNOSIS:  Left Knee disloaction/subluxation patella/knee - closed, chondromalacia  PROCEDURE:  Left knee arthroscopy, debridement, loose body removal, lateral release  SURGEON:  Surgeon(s) and Role:    * W D Carloyn Manneraffrey Jr., MD - Primary  PHYSICIAN ASSISTANT: Margart SicklesJoshua Zabian Swayne, PA-C  ASSISTANTS:  ANESTHESIA:   local and general  EBL:  Total I/O In: 1000 [I.V.:1000] Out: -   BLOOD ADMINISTERED:none  DRAINS: none   LOCAL MEDICATIONS USED:  BUPIVICAINE   SPECIMEN:  No Specimen  DISPOSITION OF SPECIMEN:  N/A  COUNTS:  YES  TOURNIQUET:    DICTATION: .Other Dictation: Dictation Number unknown  PLAN OF CARE: Discharge to home after PACU  PATIENT DISPOSITION:  PACU - hemodynamically stable.   Delay start of Pharmacological VTE agent (>24hrs) due to surgical blood loss or risk of bleeding: not applicable

## 2014-02-17 NOTE — H&P (View-Only) (Signed)
Cheryl Martinez is an 38 y.o. female.   Chief Complaint: left knee patellar dislocation lateral meniscus tear HPI: Patient seen and evaluated in outpatient clinic distant history of patellar dislocation following a fall down some stairs represented to our office with continued left knee complaints.  MRI showed findings consistent with dislocation as well as a lateral meniscus tear.  Failed conservative treatments to this point wanting to proceed with surgery.  Past Medical History  Diagnosis Date  . Hypothyroidism   . Knee dislocation 02/2014    left  . Degenerative arthritis of left knee 02/2014  . 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 Gallbladder surgery  1997  . Knee surgery Right 1994    Family History  Problem Relation Age of Onset  . Diabetes Maternal Grandmother   . Cancer Maternal Grandmother     BREAST   Social History:  reports that she has never smoked. She has never used smokeless tobacco. She reports that she drinks alcohol. She reports that she does not use illicit drugs.  Allergies:  Allergies  Allergen Reactions  . Penicillins Itching     (Not in a hospital admission)  No results found for this or any previous visit (from the past 48 hour(s)). No results found.  Review of Systems  Constitutional: Negative.   HENT: Negative.   Eyes: Negative.   Respiratory: Negative.   Cardiovascular: Negative.   Gastrointestinal: Negative.   Genitourinary: Negative.   Musculoskeletal: Positive for falls and joint pain.  Skin: Negative.   Neurological: Negative.   Endo/Heme/Allergies: Negative.   Psychiatric/Behavioral: Negative.     Last menstrual period 02/07/2014. Physical Exam  Constitutional: She is oriented to person, place, and time. She appears well-developed and well-nourished. No distress.  HENT:  Head: Normocephalic and atraumatic.  Nose: Nose normal.  Eyes:  Conjunctivae and EOM are normal. Pupils are equal, round, and reactive to light.  Neck: Normal range of motion. Neck supple.  Cardiovascular: Normal rate, regular rhythm and intact distal pulses.   Respiratory: Effort normal. No respiratory distress.  GI: Soft. She exhibits no distension. There is no tenderness.  Musculoskeletal:       Left knee: She exhibits swelling, abnormal patellar mobility and bony tenderness. She exhibits no effusion, no ecchymosis, no deformity, no laceration, no erythema, no LCL laxity and no MCL laxity. Tenderness found. Lateral joint line tenderness noted.  Neurological: She is alert and oriented to person, place, and time.  Skin: Skin is warm and dry. No rash noted. No erythema.  Psychiatric: She has a normal mood and affect. Her behavior is normal.     Assessment/Plan left knee patellar dislocation lateral meniscus tear  Discussed risks and benefits of left knee arthroscopy for lateral menisectomy, debridement, possible lateral release, open patellar realignment patient wishes to proceed.  This will be done as an outpatient procedure.  General anesthesia.    Margart SicklesChadwell, Arturo Sofranko 02/14/2014, 1:19 PM

## 2014-02-17 NOTE — Transfer of Care (Signed)
Immediate Anesthesia Transfer of Care Note  Patient: Cheryl Martinez  Procedure(s) Performed: Procedure(s): LEFT KNEE ARTHROSCOPY WITH LATERAL RELEASE, DEBRIDEMENT/SHAVING (CHONDROPLASTY), SYNOVECTOMY LIMITED WITH RECONSTRUCTION DISLOCATING  PATELLA WITH EXTENSOR REALIGNMENT MUSCLE ADVANCEMENT (Left)  Patient Location: PACU  Anesthesia Type:GA combined with regional for post-op pain  Level of Consciousness: awake, alert , oriented and patient cooperative  Airway & Oxygen Therapy: Patient Spontanous Breathing and Patient connected to face mask oxygen  Post-op Assessment: Report given to PACU RN and Post -op Vital signs reviewed and stable  Post vital signs: Reviewed and stable  Complications: No apparent anesthesia complications

## 2014-02-17 NOTE — Anesthesia Preprocedure Evaluation (Addendum)
Anesthesia Evaluation  Patient identified by MRN, date of birth, ID band Patient awake    Reviewed: Allergy & Precautions, Patient's Chart, lab work & pertinent test results  Airway Mallampati: I  Neck ROM: Full    Dental  (+) Teeth Intact   Pulmonary neg pulmonary ROS,          Cardiovascular negative cardio ROS  Rhythm:Regular Rate:Normal     Neuro/Psych PSYCHIATRIC DISORDERS Anxiety Depression    GI/Hepatic   Endo/Other  Hypothyroidism Morbid obesity  Renal/GU      Musculoskeletal   Abdominal   Peds  Hematology   Anesthesia Other Findings   Reproductive/Obstetrics                          Anesthesia Physical Anesthesia Plan  ASA: II  Anesthesia Plan: General   Post-op Pain Management:    Induction: Intravenous  Airway Management Planned: LMA  Additional Equipment:   Intra-op Plan:   Post-operative Plan: Extubation in OR  Informed Consent: I have reviewed the patients History and Physical, chart, labs and discussed the procedure including the risks, benefits and alternatives for the proposed anesthesia with the patient or authorized representative who has indicated his/her understanding and acceptance.   Dental advisory given  Plan Discussed with: CRNA and Surgeon  Anesthesia Plan Comments:         Anesthesia Quick Evaluation

## 2014-02-17 NOTE — Interval H&P Note (Signed)
History and Physical Interval Note:  02/17/2014 8:39 AM  Cheryl Martinez  has presented today for surgery, with the diagnosis of Left Knee disloaction/subluxation patella/knee - closed, Degenerative Arthritis, Chondromalcia patella; PLICA  The various methods of treatment have been discussed with the patient and family. After consideration of risks, benefits and other options for treatment, the patient has consented to  Procedure(s): LEFT KNEE ARTHROSCOPY WITH LATERAL RELEASE, DEBRIDEMENT/SHAVING (CHONDROPLASTY), SYNOVECTOMY LIMITED WITH RECONSTRUCTION DISLOCATING  PATELLA WITH EXTENSOR REALIGNMENT MUSCLE ADVANCEMENT (Left) as a surgical intervention .  The patient's history has been reviewed, patient examined, no change in status, stable for surgery.  I have reviewed the patient's chart and labs.  Questions were answered to the patient's satisfaction.     Karinna Beadles JR,W D

## 2014-02-17 NOTE — Anesthesia Postprocedure Evaluation (Signed)
  Anesthesia Post-op Note  Patient: Cheryl Martinez  Procedure(s) Performed: Procedure(s): LEFT KNEE ARTHROSCOPY WITH LATERAL RELEASE, DEBRIDEMENT/SHAVING (CHONDROPLASTY), SYNOVECTOMY LIMITED WITH RECONSTRUCTION DISLOCATING  PATELLA WITH EXTENSOR REALIGNMENT MUSCLE ADVANCEMENT (Left)  Patient Location: PACU  Anesthesia Type:General and GA combined with regional for post-op pain  Level of Consciousness: awake and alert   Airway and Oxygen Therapy: Patient Spontanous Breathing  Post-op Pain: mild  Post-op Assessment: Post-op Vital signs reviewed  Post-op Vital Signs: stable  Complications: No apparent anesthesia complications

## 2014-02-17 NOTE — Progress Notes (Signed)
Assisted Dr. Massagee with left, knee block. Side rails up, monitors on throughout procedure. See vital signs in flow sheet. Tolerated Procedure well. 

## 2014-02-17 NOTE — Anesthesia Procedure Notes (Addendum)
Anesthesia Regional Block:  Knee block  Pre-Anesthetic Checklist: ,, timeout performed, Correct Patient, Correct Site, Correct Laterality, Correct Procedure, Correct Position, site marked, Risks and benefits discussed, Surgical consent,  Pre-op evaluation,  Post-op pain management  Laterality: Left and Lower  Prep: chloraprep       Needles:   Needle Type: Other      Needle Gauge: 22 and 22 G  Needle insertion depth: 3 cm   Additional Needles: Knee block Narrative:  Start time: 02/17/2014 8:00 AM End time: 02/17/2014 8:15 AM Injection made incrementally with aspirations every 3 mL.  Performed by: Personally  Anesthesiologist: T Massagee  Additional Notes: Two inferior portals around knee joint infiltrated and into joint   Procedure Name: LMA Insertion Date/Time: 02/17/2014 8:52 AM Performed by: Kayce Betty Pre-anesthesia Checklist: Patient identified, Emergency Drugs available, Suction available and Patient being monitored Patient Re-evaluated:Patient Re-evaluated prior to inductionOxygen Delivery Method: Circle System Utilized Preoxygenation: Pre-oxygenation with 100% oxygen Intubation Type: IV induction Ventilation: Mask ventilation without difficulty LMA: LMA inserted LMA Size: 4.0 Number of attempts: 1 Airway Equipment and Method: bite block Placement Confirmation: positive ETCO2 Tube secured with: Tape Dental Injury: Teeth and Oropharynx as per pre-operative assessment

## 2014-02-20 ENCOUNTER — Encounter (HOSPITAL_BASED_OUTPATIENT_CLINIC_OR_DEPARTMENT_OTHER): Payer: Self-pay | Admitting: Orthopedic Surgery

## 2014-02-20 NOTE — Op Note (Signed)
NAME:  Cheryl Martinez, Cheryl Martinez               ACCOUNT NO.:  [id number]  MEDICAL RECORD NO.:  [id number]  LOCATION:                                 FACILITY:  PHYSICIAN:  Dyke BrackettW. D. Konica Stankowski, M.D.    DATE OF BIRTH:  Apr 18, 1976  DATE OF PROCEDURE:  02/17/2014 DATE OF DISCHARGE:  02/17/2014                              OPERATIVE REPORT   INDICATIONS:  The patient several years ago had, had an one-time traumatic event, probable patellar dislocation, spontaneous relocation. MRI was several years old.  She elected not to have any surgery done. Since that time, she has not had any recurrent instability of the patella, but she has had a lot of catching pain.  She was advised that arthroscopy was indicated.  Based on that, desire to have it out thought about the possibility of depth we are doing a lateral release. Questionable need to do something on the medial side of the knee, but again noted that we would decide that based on findings intraoperatively as well as examination under anesthesia.  PREOPERATIVE DIAGNOSES: 1. Old patellar dislocation with chondromalacia of patella. 2. Chondral injury, lateral femoral condyle. 3. Multiple cartilaginous loose bodies.  POSTOPERATIVE DIAGNOSES: 1. Old patellar dislocation with chondromalacia of patella. 2. Chondral injury, lateral femoral condyle. 3. Multiple cartilaginous loose bodies.  OPERATION: 1. Chondroplasty of patellofemoral joint lateral compartment. 2. Removal of loose bodies. 3. Arthroscopic lateral release; all for the left knee.  SURGEON:  Dyke BrackettW. D. Ching Rabideau, M.D.  ASSISTANT:  Margart SicklesJoshua Chadwell, PA-C.  ANESTHESIA:  General with some local supplementation.  DESCRIPTION OF PROCEDURE:  We could not dislocate the patella under anesthesia.  She did have moderately increased Q angle.  Intraoperative inspection of the knee on the patellofemoral joint showed relatively shallow trochlear groove.  There was some lateral displacement, old evidence of an old  retinacular tear that had healed with a small amount of gap between the patella and the retinaculum.  There was grade 3 chondromalacia diffusely that was not really confined to the lateral side, appeared to be that her medial facet and centrally the patella was tracking on the lateral trochlea.  Medial compartment essentially was normal.  The lateral compartment showed a significant chondral lesion in the lateral femoral condyle, probably 2 x 3 cm area, weightbearing posterior medial portion of the lateral condyle.  Some involvement grade 2 of the tibia without as much significant changes.  Lateral meniscus was normal.  Extensive chondroplasty was carried out.  We then elected to do an arthroscopic lateral release with a saber cut to take pressure off the patella.  Did not see a compelling reason to do a proximal realignment particularly in view of the fact that it may increase contact pressure on the patella and she gave no history of recurrent instability.  Knee drained free of fluid.  Portals closed with nylon.  Infiltrated the portal site lateral release site with 10 mL of 0.5% Marcaine. Additional 5 mL of 40 mg Depo-Medrol injected in the joint.  Lightly pressure sterile dressing applied.  Taken to the recovery room in a stable condition.     Dyke BrackettW. D. Adilynn Bessey, M.D.     WDC/MEDQ  D:  02/17/2014  T:  02/17/2014  Job:  [id number]

## 2014-03-24 ENCOUNTER — Ambulatory Visit (HOSPITAL_COMMUNITY): Payer: 59 | Attending: Cardiology | Admitting: Cardiology

## 2014-03-24 ENCOUNTER — Encounter: Payer: Self-pay | Admitting: Cardiology

## 2014-03-24 DIAGNOSIS — M79609 Pain in unspecified limb: Secondary | ICD-10-CM | POA: Insufficient documentation

## 2014-03-24 DIAGNOSIS — M7989 Other specified soft tissue disorders: Secondary | ICD-10-CM | POA: Insufficient documentation

## 2014-03-24 NOTE — Progress Notes (Signed)
Left lower extremity venous duplex completed 

## 2014-03-31 ENCOUNTER — Other Ambulatory Visit: Payer: Self-pay | Admitting: Internal Medicine

## 2014-03-31 DIAGNOSIS — R319 Hematuria, unspecified: Secondary | ICD-10-CM

## 2014-04-05 ENCOUNTER — Ambulatory Visit
Admission: RE | Admit: 2014-04-05 | Discharge: 2014-04-05 | Disposition: A | Discharge status: Home / Self Care | Payer: 59 | Source: Ambulatory Visit | Attending: Internal Medicine | Admitting: Internal Medicine

## 2014-04-05 DIAGNOSIS — R319 Hematuria, unspecified: Secondary | ICD-10-CM

## 2014-04-05 MED ORDER — IOHEXOL 300 MG/ML  SOLN
125.0000 mL | Freq: Once | INTRAMUSCULAR | Status: AC | PRN
  Administered 2014-04-05: 125 mL via INTRAVENOUS

## 2014-06-29 ENCOUNTER — Other Ambulatory Visit: Payer: Self-pay | Admitting: Family Medicine

## 2014-07-31 ENCOUNTER — Ambulatory Visit (INDEPENDENT_AMBULATORY_CARE_PROVIDER_SITE_OTHER): Payer: 59 | Admitting: Obstetrics & Gynecology

## 2014-07-31 VITALS — BP 116/76 | HR 73 | Ht 63.0 in | Wt 220.0 lb

## 2014-07-31 DIAGNOSIS — R319 Hematuria, unspecified: Secondary | ICD-10-CM

## 2014-07-31 NOTE — Patient Instructions (Signed)
Return to clinic for any scheduled appointments or for any gynecologic concerns as needed.   

## 2014-07-31 NOTE — Progress Notes (Signed)
Has been seen by [facility] and urologist.  Has had blood in urine since October.  No other symptoms.  Was referred to have uterus checked.  She does feel periods are fading, very light.

## 2014-07-31 NOTE — Progress Notes (Signed)
   CLINIC ENCOUNTER NOTE  History:  38 y.o. [id number] here today for continued evaluation of hematuria since 09/2013. Has been seen by Urologist who ruled out infection and other GU etiology for this. She reports the Urologist wanted her to have a GYN exam to investigate other etiology for her hematuria.  The following portions of the patient's history were reviewed and updated as appropriate: allergies, current medications, past family history, past medical history, past social history, past surgical history and problem list. Normal pap in 11/2013.  Review of Systems:  Pertinent items are noted in HPI.  Objective:  Physical Exam BP 116/76  Pulse 73  Ht  (1.6 m)  Wt 220 lb (99.791 kg)  BMI 38.98 kg/m2  LMP 07/27/2014 Gen: NAD Abd: Soft, nontender and nondistended Pelvic: Normal appearing external genitalia; normal appearing vaginal mucosa and cervix.  Normal discharge.  Small uterus, no other palpable masses, no uterine or adnexal tenderness  Assessment & Plan:  Persistent hematuria, negative Urology evaluation Negative GYN exam Return to Urology for further evaluation     [name], MD, FACOG Attending Obstetrician & Gynecologist Center for [facility], [facility]

## 2014-08-02 LAB — URINE CULTURE
Colony Count: NO GROWTH
ORGANISM ID, BACTERIA: NO GROWTH

## 2014-10-09 ENCOUNTER — Encounter (HOSPITAL_BASED_OUTPATIENT_CLINIC_OR_DEPARTMENT_OTHER): Payer: Self-pay | Admitting: Orthopedic Surgery

## 2015-03-02 ENCOUNTER — Other Ambulatory Visit: Payer: Self-pay | Admitting: Endocrinology

## 2015-03-02 DIAGNOSIS — E221 Hyperprolactinemia: Secondary | ICD-10-CM

## 2015-03-17 ENCOUNTER — Ambulatory Visit
Admission: RE | Admit: 2015-03-17 | Discharge: 2015-03-17 | Disposition: A | Discharge status: Home / Self Care | Payer: 59 | Source: Ambulatory Visit | Attending: Endocrinology | Admitting: Endocrinology

## 2015-03-17 DIAGNOSIS — E221 Hyperprolactinemia: Secondary | ICD-10-CM

## 2015-03-17 MED ORDER — GADOBENATE DIMEGLUMINE 529 MG/ML IV SOLN: 10.0000 mL | Freq: Once | INTRAVENOUS | Status: AC | PRN

## 2015-04-05 ENCOUNTER — Other Ambulatory Visit: Payer: Self-pay | Admitting: *Deleted

## 2015-04-05 MED ORDER — NORGESTIM-ETH ESTRAD TRIPHASIC 0.18/0.215/0.25 MG-25 MCG PO TABS: 1.0000 | ORAL_TABLET | Freq: Every day | ORAL | Status: DC

## 2015-04-05 NOTE — Telephone Encounter (Signed)
Patient has appointment set up for physical exam on May 16.  One refill authorized so she doesn't run out prior to her exam.

## 2015-04-24 ENCOUNTER — Encounter: Payer: Self-pay | Admitting: Obstetrics & Gynecology

## 2015-04-24 ENCOUNTER — Ambulatory Visit (INDEPENDENT_AMBULATORY_CARE_PROVIDER_SITE_OTHER): Payer: 59 | Admitting: Obstetrics & Gynecology

## 2015-04-24 VITALS — BP 128/79 | HR 69 | Wt 230.0 lb

## 2015-04-24 DIAGNOSIS — B379 Candidiasis, unspecified: Secondary | ICD-10-CM

## 2015-04-24 DIAGNOSIS — Z3041 Encounter for surveillance of contraceptive pills: Secondary | ICD-10-CM | POA: Diagnosis not present

## 2015-04-24 DIAGNOSIS — Z30011 Encounter for initial prescription of contraceptive pills: Secondary | ICD-10-CM

## 2015-04-24 MED ORDER — FLUCONAZOLE 150 MG PO TABS: 150.0000 mg | ORAL_TABLET | Freq: Once | ORAL | Status: DC

## 2015-04-24 MED ORDER — NORGESTIM-ETH ESTRAD TRIPHASIC 0.18/0.215/0.25 MG-25 MCG PO TABS: 1.0000 | ORAL_TABLET | Freq: Every day | ORAL | Status: DC

## 2015-04-24 NOTE — Patient Instructions (Addendum)
HPV Vaccine Gardasil (Human Papillomavirus): What You Need to Know 1. What is HPV? Genital human papillomavirus (HPV) is the most common sexually transmitted virus in the [place]. More than half of sexually active men and women are infected with HPV at some time in their lives. About 20 million Americans are currently infected, and about 6 million more get infected each year. HPV is usually spread through sexual contact. Most HPV infections don't cause any symptoms, and go away on their own. But HPV can cause cervical cancer in women. Cervical cancer is the 2nd leading cause of cancer deaths among women around the world. In the [place], about 12,000 women get cervical cancer every year and about 4,000 are expected to die from it. HPV is also associated with several less common cancers, such as vaginal and vulvar cancers in women, and anal and oropharyngeal (back of the throat, including base of tongue and tonsils) cancers in both men and women. HPV can also cause genital warts and warts in the throat. There is no cure for HPV infection, but some of the problems it causes can be treated. 2. HPV vaccine: Why get vaccinated? The HPV vaccine you are getting is one of two vaccines that can be given to prevent HPV. It may be given to both males and females.  This vaccine can prevent most cases of cervical cancer in females, if it is given before exposure to the virus. In addition, it can prevent vaginal and vulvar cancer in females, and genital warts and anal cancer in both males and females. Protection from HPV vaccine is expected to be long-lasting. But vaccination is not a substitute for cervical cancer screening. Women should still get regular Pap tests. 3. Who should get this HPV vaccine and when? HPV vaccine is given as a 3-dose series  1st Dose: Now  2nd Dose: 1 to 2 months after Dose 1  3rd Dose: 6 months after Dose 1 Additional (booster) doses are not recommended. Routine  vaccination  This HPV vaccine is recommended for girls and boys 11 or 37 years of age. It may be given starting at age 9. Why is HPV vaccine recommended at 11 or 37 years of age?  HPV infection is easily acquired, even with only one sex partner. That is why it is important to get HPV vaccine before any sexual contact takes place. Also, response to the vaccine is better at this age than at older ages. Catch-up vaccination This vaccine is recommended for the following people who have not completed the 3-dose series:   Females 13 through 37 years of age.  Males 13 through 37 years of age. This vaccine may be given to men 22 through 37 years of age who have not completed the 3-dose series. It is recommended for men through age 26 who have sex with men or whose immune system is weakened because of HIV infection, other illness, or medications.  HPV vaccine may be given at the same time as other vaccines. 4. Some people should not get HPV vaccine or should wait.  Anyone who has ever had a life-threatening allergic reaction to any component of HPV vaccine, or to a previous dose of HPV vaccine, should not get the vaccine. Tell your doctor if the person getting vaccinated has any severe allergies, including an allergy to yeast.  HPV vaccine is not recommended for pregnant women. However, receiving HPV vaccine when pregnant is not a reason to consider terminating the pregnancy. Women who are breast   feeding may get the vaccine.  People who are mildly ill when a dose of HPV is planned can still be vaccinated. People with a moderate or severe illness should wait until they are better. 5. What are the risks from this vaccine? This HPV vaccine has been used in the [place] and around the world for about six years and has been very safe. However, any medicine could possibly cause a serious problem, such as a severe allergic reaction. The risk of any vaccine causing a serious injury, or death, is extremely  small. Life-threatening allergic reactions from vaccines are very rare. If they do occur, it would be within a few minutes to a few hours after the vaccination. Several mild to moderate problems are known to occur with this HPV vaccine. These do not last long and go away on their own.  Reactions in the arm where the shot was given:  Pain (about 8 people in 10)  Redness or swelling (about 1 person in 4)  Fever:  Mild (100 F) (about 1 person in 10)  Moderate (102 F) (about 1 person in 5465)  Other problems:  Headache (about 1 person in 3)  Fainting: Brief fainting spells and related symptoms (such as jerking movements) can happen after any medical procedure, including vaccination. Sitting or lying down for about 15 minutes after a vaccination can help prevent fainting and injuries caused by falls. Tell your doctor if the patient feels dizzy or light-headed, or has vision changes or ringing in the ears.  Like all vaccines, HPV vaccines will continue to be monitored for unusual or severe problems. 6. What if there is a serious reaction? What should I look for?  Look for anything that concerns you, such as signs of a severe allergic reaction, very high fever, or behavior changes. Signs of a severe allergic reaction can include hives, swelling of the face and throat, difficulty breathing, a fast heartbeat, dizziness, and weakness. These would start a few minutes to a few hours after the vaccination.  What should I do?  If you think it is a severe allergic reaction or other emergency that can't wait, call 9-1-1 or get the person to the nearest hospital. Otherwise, call your doctor.  Afterward, the reaction should be reported to the Vaccine Adverse Event Reporting System (VAERS). Your doctor might file this report, or you can do it yourself through the VAERS web site at www.vaers.LAgents.nohhs.gov, or by calling 1-772-695-7092. VAERS is only for reporting reactions. They do not give medical  advice. 7. The National Vaccine Injury Compensation Program  The [employer] Vaccine Injury Compensation Program (VICP) is a federal program that was created to compensate people who may have been injured by certain vaccines.  Persons who believe they may have been injured by a vaccine can learn about the program and about filing a claim by calling 1-616-352-7958 or visiting the VICP website at SpiritualWord.atwww.hrsa.gov/vaccinecompensation. 8. How can I learn more?  Ask your doctor.  Call your local or state health department.  Contact the Centers for Disease Control and Prevention (CDC):  Call (905) 311-70201-325-848-0370 (1-800-CDC-INFO)  or  Visit CDC's website at PicCapture.uywww.cdc.gov/vaccines CDC Human Papillomavirus (HPV) Gardasil (Interim) 04/23/12 Document Released: 09/21/2006 Document Revised: 04/10/2014 Document Reviewed: 01/05/2014 ExitCare Patient Information 2015 [employer] [place]. This information is not intended to replace advice given to you by your health care provider. Make sure you discuss any questions you have with your health care provider.   Laparoscopic Tubal Ligation Laparoscopic tubal ligation is a procedure that  closes the fallopian tubes at a time other than right after childbirth. By closing the fallopian tubes, the eggs that are released from the ovaries cannot enter the uterus and sperm cannot reach the egg. Tubal ligation is also known as getting your "tubes tied." Tubal ligation is done so you will not be able to get pregnant or have a baby.  Although this procedure may be reversed, it should be considered permanent and irreversible. If you want to have future pregnancies, you should not have this procedure.  LET YOUR CAREGIVER KNOW ABOUT:  Allergies to food or medicine.  Medicines taken, including vitamins, herbs, eyedrops, over-the-counter medicines, and creams.  Use of steroids (by mouth or creams).  Previous problems with numbing medicines.  History of bleeding problems or blood  clots.  Any recent colds or infections.  Previous surgery.  Other health problems, including diabetes and kidney problems.  Possibility of pregnancy, if this applies.  Any past pregnancies. RISKS AND COMPLICATIONS   Infection.  Bleeding.  Injury to surrounding organs.  Anesthetic side effects.  Failure of the procedure.  Ectopic pregnancy.  Future regret about having the procedure done. BEFORE THE PROCEDURE  Do not take aspirin or blood thinners a week before the procedure or as directed. This can cause bleeding.  Do not eat or drink anything 6 to 8 hours before the procedure. PROCEDURE   You may be given a medicine to help you relax (sedative) before the procedure. You will be given a medicine to make you sleep (general anesthetic) during the procedure.  A tube will be put down your throat to help your breath while under general anesthesia.  Two small cuts (incisions) are made in the lower abdominal area and near the belly button.  Your abdominal area will be inflated with a safe gas (carbon dioxide). This helps give the surgeon room to operate, visualize, and helps the surgeon avoid other organs.  A thin, lighted tube (laparoscope) with a camera attached is inserted into your abdomen through one of the incisions near the belly button. Other small instruments are also inserted through the other abdominal incision.  The fallopian tubes are located and are either blocked with a ring, clip, or are burned (cauterized).  After the fallopian tubes are blocked, the gas is released from the abdomen.  The incisions will be closed with stitches (sutures), and a bandage may be placed over the incisions. AFTER THE PROCEDURE   You will rest in a recovery room for 1--4 hours until you are stable and doing well.  You will also have some mild abdominal discomfort for 3--7 days. You will be given pain medicine to ease any discomfort.  As long as there are no problems, you may be  allowed to go home. Someone will need to drive you home and be with you for at least 24 hours once home.  You may have some mild discomfort in the throat. This is from the tube placed in your throat while you were sleeping.  You may experience discomfort in the shoulder area from some trapped air between the liver and diaphragm. This sensation is normal and will slowly go away on its own. Document Released: 03/02/2001 Document Revised: 05/25/2012 Document Reviewed: 03/06/2012 [facility] Patient Information 2015 [place], [place]. This information is not intended to replace advice given to you by your health care provider. Make sure you discuss any questions you have with your health care provider.

## 2015-04-24 NOTE — Progress Notes (Signed)
   CLINIC ENCOUNTER NOTE  History:  39 y.o. [name] here today for follow up of contraceptive method.  Patient is on Ortho Tricyclen Lo, satisfied with method.  No abnormal bleeding; she loves having a  period that lasts 3 days every month courtesy of her OCPs.  She does report having a current yeast infection for a few days and desires oral therapy. No other GYN symptoms.   Past Medical History  Diagnosis Date  . Hypothyroidism   . Knee dislocation 02/2014    left  . Degenerative arthritis of left knee 02/2014  . 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 Gallbladder surgery  1997  . Knee surgery Right 1994  . Knee arthroscopy with patella reconstruction Left 02/17/2014    Procedure: LEFT KNEE ARTHROSCOPY WITH LATERAL RELEASE, DEBRIDEMENT/SHAVING (CHONDROPLASTY), SYNOVECTOMY LIMITED WITH RECONSTRUCTION DISLOCATING  PATELLA WITH EXTENSOR REALIGNMENT MUSCLE ADVANCEMENT;  Surgeon: Thera FlakeW D Caffrey Jr., MD;  Location: [facility];  Service: Orthopedics;  Laterality: Left;    The following portions of the patient's history were reviewed and updated as appropriate: allergies, current medications, past family history, past medical history, past social history, past surgical history and problem list.   Health Maintenance:  Normal pap in 11/07/2013  Review of Systems:  A comprehensive review of systems was negative.  Objective:  Physical Exam BP 128/79 mmHg  Pulse 69  Wt 230 lb (104.327 kg) CONSTITUTIONAL: Well-developed, well-nourished female in no acute distress.  HENT:  Normocephalic, atraumatic, External right and left ear normal. Oropharynx is clear and moist EYES: Conjunctivae and EOM are normal. Pupils are equal, round, and reactive to light. No scleral icterus.  NECK: Normal range of motion, supple, no masses SKIN: Skin is warm and dry. No rash noted. Not diaphoretic. No erythema. No  pallor. NEUROLGIC: Alert and oriented to person, place, and time. Normal reflexes, muscle tone coordination. No cranial nerve deficit noted. PSYCHIATRIC: Normal mood and affect. Normal behavior. Normal judgment and thought content. CARDIOVASCULAR: Normal heart rate noted RESPIRATORY: Effort and breath sounds normal, no problems with respiration noted ABDOMEN: Soft, obese, no distention noted.  PELVIC: Deferred. MUSCULOSKELETAL: Normal range of motion. No edema and no tenderness.  Assessment & Plan:  Will continue OCPs; new prescription ordered for her with a year's supply.  Patient is contemplating sterilization, information was given to her to review at home and she was advised to let us know if she decides to proceed with this. Emphasized that her periods will not be controlled by tubal sterilization.  Diflucan also prescribed for presumed yeast infection; she was told to call for any worsening symptoms as she may need further evaluation. Routine preventative health maintenance measures emphasized.    Cheryl CollinsUGONNA  Cheryl Kleman, MD, FACOG Attending Obstetrician & Gynecologist Center for [facility], [facility]

## 2015-08-28 ENCOUNTER — Telehealth: Payer: Self-pay | Admitting: *Deleted

## 2015-08-28 DIAGNOSIS — Z30011 Encounter for initial prescription of contraceptive pills: Secondary | ICD-10-CM

## 2015-08-28 MED ORDER — NORGESTIM-ETH ESTRAD TRIPHASIC 0.18/0.215/0.25 MG-25 MCG PO TABS: 1.0000 | ORAL_TABLET | Freq: Every day | ORAL | Status: DC

## 2015-08-28 NOTE — Telephone Encounter (Signed)
Received a rx request for Ortho tri cyclen from [facility].  Rx was originally sent to [facility], pt requesting to change to [facility].  [facility] with remainder of refills.

## 2015-09-25 ENCOUNTER — Telehealth: Payer: Self-pay | Admitting: *Deleted

## 2015-09-25 NOTE — Telephone Encounter (Signed)
-----   Message from Olevia BowensJacinda S Battle sent at 09/25/2015 10:12 AM EDT ----- Regarding: Birth Control Contact: [phone] Insurance changed, now doing mail order Rx....through mail getting generic med, can get brand mail through mail but need a Dr. to write a brand specific on RX. Will fax form to fill out to office

## 2015-09-25 NOTE — Telephone Encounter (Signed)
Completed Prescription request form from [facility], form faxed.

## 2015-10-04 ENCOUNTER — Encounter: Payer: Self-pay | Admitting: *Deleted

## 2016-01-07 ENCOUNTER — Telehealth: Payer: Self-pay | Admitting: *Deleted

## 2016-01-07 DIAGNOSIS — B379 Candidiasis, unspecified: Secondary | ICD-10-CM

## 2016-01-07 MED ORDER — FLUCONAZOLE 150 MG PO TABS: 150.0000 mg | ORAL_TABLET | Freq: Once | ORAL | Status: DC

## 2016-01-07 NOTE — Telephone Encounter (Signed)
Patient having white discharge and would like something called in for a yeast infection.  I have sent in Diflucan to patients pharmacy.

## 2016-01-07 NOTE — Telephone Encounter (Signed)
-----   Message from Olevia Bowens sent at 01/04/2016 10:55 AM EST ----- Regarding: Rx Request Contact: [phone] Would like something called in for a yeast infection Uses [facility] in [place]

## 2016-09-25 ENCOUNTER — Ambulatory Visit (INDEPENDENT_AMBULATORY_CARE_PROVIDER_SITE_OTHER): Payer: BLUE CROSS/BLUE SHIELD | Admitting: Obstetrics & Gynecology

## 2016-09-25 ENCOUNTER — Encounter: Payer: Self-pay | Admitting: Obstetrics & Gynecology

## 2016-09-25 ENCOUNTER — Other Ambulatory Visit: Payer: Self-pay | Admitting: *Deleted

## 2016-09-25 VITALS — BP 151/87 | HR 97 | Ht 64.0 in | Wt 222.0 lb

## 2016-09-25 DIAGNOSIS — Z01419 Encounter for gynecological examination (general) (routine) without abnormal findings: Secondary | ICD-10-CM | POA: Diagnosis not present

## 2016-09-25 DIAGNOSIS — Z3041 Encounter for surveillance of contraceptive pills: Secondary | ICD-10-CM | POA: Diagnosis not present

## 2016-09-25 DIAGNOSIS — Z124 Encounter for screening for malignant neoplasm of cervix: Secondary | ICD-10-CM

## 2016-09-25 DIAGNOSIS — Z30011 Encounter for initial prescription of contraceptive pills: Secondary | ICD-10-CM

## 2016-09-25 DIAGNOSIS — Z1151 Encounter for screening for human papillomavirus (HPV): Secondary | ICD-10-CM

## 2016-09-25 MED ORDER — NORGESTIM-ETH ESTRAD TRIPHASIC 0.18/0.215/0.25 MG-25 MCG PO TABS: 1.0000 | ORAL_TABLET | Freq: Every day | ORAL | 5 refills | Status: DC

## 2016-09-25 MED ORDER — NORGESTIM-ETH ESTRAD TRIPHASIC 0.18/0.215/0.25 MG-25 MCG PO TABS
1.0000 | ORAL_TABLET | Freq: Every day | ORAL | 5 refills | Status: DC
Start: 2016-09-25 — End: 2016-10-02

## 2016-09-25 NOTE — Patient Instructions (Addendum)
Thank you for enrolling in [facility]. Please follow the instructions below to securely access your online medical record. MyChart allows you to send messages to your doctor, view your test results, manage appointments, and more.   How Do I Sign Up? 1. In your Internet browser, go to [employer] and enter [email or web address]. 2. Click on the Sign Up Now link in the Sign In box. You will see the New Member Sign Up page. 3. Enter your MyChart Access Code exactly as it appears below. You will not need to use this code after you've completed the sign-up process. If you do not sign up before the expiration date, you must request a new code.  MyChart Access Code: [email or web address] Expires: 11/24/2016 11:19 AM  4. Enter your Social Security Number ([id number]) and Date of Birth (mm/dd/yyyy) as indicated and click Submit. You will be taken to the next sign-up page. 5. Create a MyChart ID. This will be your MyChart login ID and cannot be changed, so think of one that is secure and easy to remember. 6. Create a MyChart password. You can change your password at any time. 7. Enter your Password Reset Question and Answer. This can be used at a later time if you forget your password.  8. Enter your e-mail address. You will receive e-mail notification when new information is available in [facility]. 9. Click Sign Up. You can now view your medical record.   Additional Information Remember, MyChart is NOT to be used for urgent needs. For medical emergencies, dial 911.      Preventive Care for Adults, Female A healthy lifestyle and preventive care can promote health and wellness. Preventive health guidelines for women include the following key practices.  A routine yearly physical is a good way to check with your health care provider about your health and preventive screening. It is a chance to share any concerns and updates on your health and to receive a thorough exam.  Visit your dentist for  a routine exam and preventive care every 6 months. Brush your teeth twice a day and floss once a day. Good oral hygiene prevents tooth decay and gum disease.  The frequency of eye exams is based on your age, health, family medical history, use of contact lenses, and other factors. Follow your health care provider's recommendations for frequency of eye exams.  Eat a healthy diet. Foods like vegetables, fruits, whole grains, low-fat dairy products, and lean protein foods contain the nutrients you need without too many calories. Decrease your intake of foods high in solid fats, added sugars, and salt. Eat the right amount of calories for you.Get information about a proper diet from your health care provider, if necessary.  Regular physical exercise is one of the most important things you can do for your health. Most adults should get at least 150 minutes of moderate-intensity exercise (any activity that increases your heart rate and causes you to sweat) each week. In addition, most adults need muscle-strengthening exercises on 2 or more days a week.  Maintain a healthy weight. The body mass index (BMI) is a screening tool to identify possible weight problems. It provides an estimate of body fat based on height and weight. Your health care provider can find your BMI and can help you achieve or maintain a healthy weight.For adults 20 years and older:  A BMI below 18.5 is considered underweight.  A BMI of 18.5 to 24.9 is normal.  A BMI of 25 to 29.9 is considered overweight.  A BMI of 30 and above is considered obese.  Maintain normal blood lipids and cholesterol levels by exercising and minimizing your intake of saturated fat. Eat a balanced diet with plenty of fruit and vegetables. Blood tests for lipids and cholesterol should begin at age 55 and be repeated every 5 years. If your lipid or cholesterol levels are high, you are over 50, or you are at high risk for heart disease, you may need your  cholesterol levels checked more frequently.Ongoing high lipid and cholesterol levels should be treated with medicines if diet and exercise are not working.  If you smoke, find out from your health care provider how to quit. If you do not use tobacco, do not start.  Lung cancer screening is recommended for adults aged 12-80 years who are at high risk for developing lung cancer because of a history of smoking. A yearly low-dose CT scan of the lungs is recommended for people who have at least a 30-pack-year history of smoking and are a current smoker or have quit within the past 15 years. A pack year of smoking is smoking an average of 1 pack of cigarettes a day for 1 year (for example: 1 pack a day for 30 years or 2 packs a day for 15 years). Yearly screening should continue until the smoker has stopped smoking for at least 15 years. Yearly screening should be stopped for people who develop a health problem that would prevent them from having lung cancer treatment.  If you are pregnant, do not drink alcohol. If you are breastfeeding, be very cautious about drinking alcohol. If you are not pregnant and choose to drink alcohol, do not have more than 1 drink per day. One drink is considered to be 12 ounces (355 mL) of beer, 5 ounces (148 mL) of wine, or 1.5 ounces (44 mL) of liquor.  Avoid use of street drugs. Do not share needles with anyone. Ask for help if you need support or instructions about stopping the use of drugs.  High blood pressure causes heart disease and increases the risk of stroke. Your blood pressure should be checked at least every 1 to 2 years. Ongoing high blood pressure should be treated with medicines if weight loss and exercise do not work.  If you are 11-68 years old, ask your health care provider if you should take aspirin to prevent strokes.  Diabetes screening is done by taking a blood sample to check your blood glucose level after you have not eaten for a certain period of time  (fasting). If you are not overweight and you do not have risk factors for diabetes, you should be screened once every 3 years starting at age 81. If you are overweight or obese and you are 37-86 years of age, you should be screened for diabetes every year as part of your cardiovascular risk assessment.  Breast cancer screening is essential preventive care for women. You should practice "breast self-awareness." This means understanding the normal appearance and feel of your breasts and may include breast self-examination. Any changes detected, no matter how small, should be reported to a health care provider. Women in their 85s and 30s should have a clinical breast exam (CBE) by a health care provider as part of a regular health exam every 1 to 3 years. After age 22, women should have a CBE every year. Starting at age 65, women should consider having a mammogram (breast X-ray test) every year. Women who have a family history of  breast cancer should talk to their health care provider about genetic screening. Women at a high risk of breast cancer should talk to their health care providers about having an MRI and a mammogram every year.  Breast cancer gene (BRCA)-related cancer risk assessment is recommended for women who have family members with BRCA-related cancers. BRCA-related cancers include breast, ovarian, tubal, and peritoneal cancers. Having family members with these cancers may be associated with an increased risk for harmful changes (mutations) in the breast cancer genes BRCA1 and BRCA2. Results of the assessment will determine the need for genetic counseling and BRCA1 and BRCA2 testing.  Your health care provider may recommend that you be screened regularly for cancer of the pelvic organs (ovaries, uterus, and vagina). This screening involves a pelvic examination, including checking for microscopic changes to the surface of your cervix (Pap test). You may be encouraged to have this screening done every  3 years, beginning at age 52.  For women ages 57-65, health care providers may recommend pelvic exams and Pap testing every 3 years, or they may recommend the Pap and pelvic exam, combined with testing for human papilloma virus (HPV), every 5 years. Some types of HPV increase your risk of cervical cancer. Testing for HPV may also be done on women of any age with unclear Pap test results.  Other health care providers may not recommend any screening for nonpregnant women who are considered low risk for pelvic cancer and who do not have symptoms. Ask your health care provider if a screening pelvic exam is right for you.  If you have had past treatment for cervical cancer or a condition that could lead to cancer, you need Pap tests and screening for cancer for at least 20 years after your treatment. If Pap tests have been discontinued, your risk factors (such as having a new sexual partner) need to be reassessed to determine if screening should resume. Some women have medical problems that increase the chance of getting cervical cancer. In these cases, your health care provider may recommend more frequent screening and Pap tests.  Colorectal cancer can be detected and often prevented. Most routine colorectal cancer screening begins at the age of 43 years and continues through age 49 years. However, your health care provider may recommend screening at an earlier age if you have risk factors for colon cancer. On a yearly basis, your health care provider may provide home test kits to check for hidden blood in the stool. Use of a small camera at the end of a tube, to directly examine the colon (sigmoidoscopy or colonoscopy), can detect the earliest forms of colorectal cancer. Talk to your health care provider about this at age 66, when routine screening begins. Direct exam of the colon should be repeated every 5-10 years through age 66 years, unless early forms of precancerous polyps or small growths are  found.  People who are at an increased risk for hepatitis B should be screened for this virus. You are considered at high risk for hepatitis B if:  You were born in a country where hepatitis B occurs often. Talk with your health care provider about which countries are considered high risk.  Your parents were born in a high-risk country and you have not received a shot to protect against hepatitis B (hepatitis B vaccine).  You have HIV or AIDS.  You use needles to inject street drugs.  You live with, or have sex with, someone who has hepatitis B.  You get  hemodialysis treatment.  You take certain medicines for conditions like cancer, organ transplantation, and autoimmune conditions.  Hepatitis C blood testing is recommended for all people born from [birth year] through 1963 and any individual with known risks for hepatitis C.  Practice safe sex. Use condoms and avoid high-risk sexual practices to reduce the spread of sexually transmitted infections (STIs). STIs include gonorrhea, chlamydia, syphilis, trichomonas, herpes, HPV, and human immunodeficiency virus (HIV). Herpes, HIV, and HPV are viral illnesses that have no cure. They can result in disability, cancer, and death.  You should be screened for sexually transmitted illnesses (STIs) including gonorrhea and chlamydia if:  You are sexually active and are younger than 24 years.  You are older than 24 years and your health care provider tells you that you are at risk for this type of infection.  Your sexual activity has changed since you were last screened and you are at an increased risk for chlamydia or gonorrhea. Ask your health care provider if you are at risk.  If you are at risk of being infected with HIV, it is recommended that you take a prescription medicine daily to prevent HIV infection. This is called preexposure prophylaxis (PrEP). You are considered at risk if:  You are sexually active and do not regularly use condoms or know  the HIV status of your partner(s).  You take drugs by injection.  You are sexually active with a partner who has HIV.  Talk with your health care provider about whether you are at high risk of being infected with HIV. If you choose to begin PrEP, you should first be tested for HIV. You should then be tested every 3 months for as long as you are taking PrEP.  Osteoporosis is a disease in which the bones lose minerals and strength with aging. This can result in serious bone fractures or breaks. The risk of osteoporosis can be identified using a bone density scan. Women ages 21 years and over and women at risk for fractures or osteoporosis should discuss screening with their health care providers. Ask your health care provider whether you should take a calcium supplement or vitamin D to reduce the rate of osteoporosis.  Menopause can be associated with physical symptoms and risks. Hormone replacement therapy is available to decrease symptoms and risks. You should talk to your health care provider about whether hormone replacement therapy is right for you.  Use sunscreen. Apply sunscreen liberally and repeatedly throughout the day. You should seek shade when your shadow is shorter than you. Protect yourself by wearing long sleeves, pants, a wide-brimmed hat, and sunglasses year round, whenever you are outdoors.  Once a month, do a whole body skin exam, using a mirror to look at the skin on your back. Tell your health care provider of new moles, moles that have irregular borders, moles that are larger than a pencil eraser, or moles that have changed in shape or color.  Stay current with required vaccines (immunizations).  Influenza vaccine. All adults should be immunized every year.  Tetanus, diphtheria, and acellular pertussis (Td, Tdap) vaccine. Pregnant women should receive 1 dose of Tdap vaccine during each pregnancy. The dose should be obtained regardless of the length of time since the last  dose. Immunization is preferred during the 27th-36th week of gestation. An adult who has not previously received Tdap or who does not know her vaccine status should receive 1 dose of Tdap. This initial dose should be followed by tetanus and diphtheria toxoids (Td)  booster doses every 10 years. Adults with an unknown or incomplete history of completing a 3-dose immunization series with Td-containing vaccines should begin or complete a primary immunization series including a Tdap dose. Adults should receive a Td booster every 10 years.  Varicella vaccine. An adult without evidence of immunity to varicella should receive 2 doses or a second dose if she has previously received 1 dose. Pregnant females who do not have evidence of immunity should receive the first dose after pregnancy. This first dose should be obtained before leaving the health care facility. The second dose should be obtained 4-8 weeks after the first dose.  Human papillomavirus (HPV) vaccine. Females aged 13-26 years who have not received the vaccine previously should obtain the 3-dose series. The vaccine is not recommended for use in pregnant females. However, pregnancy testing is not needed before receiving a dose. If a female is found to be pregnant after receiving a dose, no treatment is needed. In that case, the remaining doses should be delayed until after the pregnancy. Immunization is recommended for any person with an immunocompromised condition through the age of 69 years if she did not get any or all doses earlier. During the 3-dose series, the second dose should be obtained 4-8 weeks after the first dose. The third dose should be obtained 24 weeks after the first dose and 16 weeks after the second dose.  Zoster vaccine. One dose is recommended for adults aged 50 years or older unless certain conditions are present.  Measles, mumps, and rubella (MMR) vaccine. Adults born before [birth year] generally are considered immune to measles and  mumps. Adults born in [birth year] or later should have 1 or more doses of MMR vaccine unless there is a contraindication to the vaccine or there is laboratory evidence of immunity to each of the three diseases. A routine second dose of MMR vaccine should be obtained at least 28 days after the first dose for students attending postsecondary schools, health care workers, or international travelers. People who received inactivated measles vaccine or an unknown type of measles vaccine during 1963-1967 should receive 2 doses of MMR vaccine. People who received inactivated mumps vaccine or an unknown type of mumps vaccine before 1979 and are at high risk for mumps infection should consider immunization with 2 doses of MMR vaccine. For females of childbearing age, rubella immunity should be determined. If there is no evidence of immunity, females who are not pregnant should be vaccinated. If there is no evidence of immunity, females who are pregnant should delay immunization until after pregnancy. Unvaccinated health care workers born before [birth year] who lack laboratory evidence of measles, mumps, or rubella immunity or laboratory confirmation of disease should consider measles and mumps immunization with 2 doses of MMR vaccine or rubella immunization with 1 dose of MMR vaccine.  Pneumococcal 13-valent conjugate (PCV13) vaccine. When indicated, a person who is uncertain of his immunization history and has no record of immunization should receive the PCV13 vaccine. All adults 5 years of age and older should receive this vaccine. An adult aged 87 years or older who has certain medical conditions and has not been previously immunized should receive 1 dose of PCV13 vaccine. This PCV13 should be followed with a dose of pneumococcal polysaccharide (PPSV23) vaccine. Adults who are at high risk for pneumococcal disease should obtain the PPSV23 vaccine at least 8 weeks after the dose of PCV13 vaccine. Adults older than 38 years of age who  have normal immune system function should obtain  the PPSV23 vaccine dose at least 1 year after the dose of PCV13 vaccine.  Pneumococcal polysaccharide (PPSV23) vaccine. When PCV13 is also indicated, PCV13 should be obtained first. All adults aged 31 years and older should be immunized. An adult younger than age 42 years who has certain medical conditions should be immunized. Any person who resides in a nursing home or long-term care facility should be immunized. An adult smoker should be immunized. People with an immunocompromised condition and certain other conditions should receive both PCV13 and PPSV23 vaccines. People with human immunodeficiency virus (HIV) infection should be immunized as soon as possible after diagnosis. Immunization during chemotherapy or radiation therapy should be avoided. Routine use of PPSV23 vaccine is not recommended for American Indians, [place] Natives, or people younger than 65 years unless there are medical conditions that require PPSV23 vaccine. When indicated, people who have unknown immunization and have no record of immunization should receive PPSV23 vaccine. One-time revaccination 5 years after the first dose of PPSV23 is recommended for people aged 19-64 years who have chronic kidney failure, nephrotic syndrome, asplenia, or immunocompromised conditions. People who received 1-2 doses of PPSV23 before age 63 years should receive another dose of PPSV23 vaccine at age 68 years or later if at least 5 years have passed since the previous dose. Doses of PPSV23 are not needed for people immunized with PPSV23 at or after age 79 years.  Meningococcal vaccine. Adults with asplenia or persistent complement component deficiencies should receive 2 doses of quadrivalent meningococcal conjugate (MenACWY-D) vaccine. The doses should be obtained at least 2 months apart. Microbiologists working with certain meningococcal bacteria, [employer] recruits, people at risk during an outbreak, and  people who travel to or live in countries with a high rate of meningitis should be immunized. A first-year college student up through age 67 years who is living in a residence hall should receive a dose if she did not receive a dose on or after her 16th birthday. Adults who have certain high-risk conditions should receive one or more doses of vaccine.  Hepatitis A vaccine. Adults who wish to be protected from this disease, have certain high-risk conditions, work with hepatitis A-infected animals, work in hepatitis A research labs, or travel to or work in countries with a high rate of hepatitis A should be immunized. Adults who were previously unvaccinated and who anticipate close contact with an international adoptee during the first 60 days after arrival in the [place] from a country with a high rate of hepatitis A should be immunized.  Hepatitis B vaccine. Adults who wish to be protected from this disease, have certain high-risk conditions, may be exposed to blood or other infectious body fluids, are household contacts or sex partners of hepatitis B positive people, are clients or workers in certain care facilities, or travel to or work in countries with a high rate of hepatitis B should be immunized.  Haemophilus influenzae type b (Hib) vaccine. A previously unvaccinated person with asplenia or sickle cell disease or having a scheduled splenectomy should receive 1 dose of Hib vaccine. Regardless of previous immunization, a recipient of a hematopoietic stem cell transplant should receive a 3-dose series 6-12 months after her successful transplant. Hib vaccine is not recommended for adults with HIV infection. Preventive Services / Frequency Ages 68 to 68 years  Blood pressure check.** / Every 3-5 years.  Lipid and cholesterol check.** / Every 5 years beginning at age 53.  Clinical breast exam.** / Every 3 years for women  in their 64s and 30s.  BRCA-related cancer risk assessment.** / For women  who have family members with a BRCA-related cancer (breast, ovarian, tubal, or peritoneal cancers).  Pap test.** / Every 2 years from ages 52 through 14. Every 3 years starting at age 40 through age 97 or 32 with a history of 3 consecutive normal Pap tests.  HPV screening.** / Every 3 years from ages 90 through ages 42 to 38 with a history of 3 consecutive normal Pap tests.  Hepatitis C blood test.** / For any individual with known risks for hepatitis C.  Skin self-exam. / Monthly.  Influenza vaccine. / Every year.  Tetanus, diphtheria, and acellular pertussis (Tdap, Td) vaccine.** / Consult your health care provider. Pregnant women should receive 1 dose of Tdap vaccine during each pregnancy. 1 dose of Td every 10 years.  Varicella vaccine.** / Consult your health care provider. Pregnant females who do not have evidence of immunity should receive the first dose after pregnancy.  HPV vaccine. / 3 doses over 6 months, if 23 and younger. The vaccine is not recommended for use in pregnant females. However, pregnancy testing is not needed before receiving a dose.  Measles, mumps, rubella (MMR) vaccine.** / You need at least 1 dose of MMR if you were born in 1957 or later. You may also need a 2nd dose. For females of childbearing age, rubella immunity should be determined. If there is no evidence of immunity, females who are not pregnant should be vaccinated. If there is no evidence of immunity, females who are pregnant should delay immunization until after pregnancy.  Pneumococcal 13-valent conjugate (PCV13) vaccine.** / Consult your health care provider.  Pneumococcal polysaccharide (PPSV23) vaccine.** / 1 to 2 doses if you smoke cigarettes or if you have certain conditions.  Meningococcal vaccine.** / 1 dose if you are age 23 to 58 years and a Market researcher living in a residence hall, or have one of several medical conditions, you need to get vaccinated against meningococcal  disease. You may also need additional booster doses.  Hepatitis A vaccine.** / Consult your health care provider.  Hepatitis B vaccine.** / Consult your health care provider.  Haemophilus influenzae type b (Hib) vaccine.** / Consult your health care provider. Ages 4 to 82 years  Blood pressure check.** / Every year.  Lipid and cholesterol check.** / Every 5 years beginning at age 64 years.  Lung cancer screening. / Every year if you are aged 64-80 years and have a 30-pack-year history of smoking and currently smoke or have quit within the past 15 years. Yearly screening is stopped once you have quit smoking for at least 15 years or develop a health problem that would prevent you from having lung cancer treatment.  Clinical breast exam.** / Every year after age 79 years.  BRCA-related cancer risk assessment.** / For women who have family members with a BRCA-related cancer (breast, ovarian, tubal, or peritoneal cancers).  Mammogram.** / Every year beginning at age 38 years and continuing for as long as you are in good health. Consult with your health care provider.  Pap test.** / Every 3 years starting at age 61 years through age 58 or 37 years with a history of 3 consecutive normal Pap tests.  HPV screening.** / Every 3 years from ages 56 years through ages 6 to 1 years with a history of 3 consecutive normal Pap tests.  Fecal occult blood test (FOBT) of stool. / Every year beginning at age 54  years and continuing until age 42 years. You may not need to do this test if you get a colonoscopy every 10 years.  Flexible sigmoidoscopy or colonoscopy.** / Every 5 years for a flexible sigmoidoscopy or every 10 years for a colonoscopy beginning at age 39 years and continuing until age 32 years.  Hepatitis C blood test.** / For all people born from [birth year] through 1963 and any individual with known risks for hepatitis C.  Skin self-exam. / Monthly.  Influenza vaccine. / Every year.  Tetanus,  diphtheria, and acellular pertussis (Tdap/Td) vaccine.** / Consult your health care provider. Pregnant women should receive 1 dose of Tdap vaccine during each pregnancy. 1 dose of Td every 10 years.  Varicella vaccine.** / Consult your health care provider. Pregnant females who do not have evidence of immunity should receive the first dose after pregnancy.  Zoster vaccine.** / 1 dose for adults aged 21 years or older.  Measles, mumps, rubella (MMR) vaccine.** / You need at least 1 dose of MMR if you were born in 1957 or later. You may also need a second dose. For females of childbearing age, rubella immunity should be determined. If there is no evidence of immunity, females who are not pregnant should be vaccinated. If there is no evidence of immunity, females who are pregnant should delay immunization until after pregnancy.  Pneumococcal 13-valent conjugate (PCV13) vaccine.** / Consult your health care provider.  Pneumococcal polysaccharide (PPSV23) vaccine.** / 1 to 2 doses if you smoke cigarettes or if you have certain conditions.  Meningococcal vaccine.** / Consult your health care provider.  Hepatitis A vaccine.** / Consult your health care provider.  Hepatitis B vaccine.** / Consult your health care provider.  Haemophilus influenzae type b (Hib) vaccine.** / Consult your health care provider. Ages 41 years and over  Blood pressure check.** / Every year.  Lipid and cholesterol check.** / Every 5 years beginning at age 71 years.  Lung cancer screening. / Every year if you are aged 64-80 years and have a 30-pack-year history of smoking and currently smoke or have quit within the past 15 years. Yearly screening is stopped once you have quit smoking for at least 15 years or develop a health problem that would prevent you from having lung cancer treatment.  Clinical breast exam.** / Every year after age 56 years.  BRCA-related cancer risk assessment.** / For women who have family  members with a BRCA-related cancer (breast, ovarian, tubal, or peritoneal cancers).  Mammogram.** / Every year beginning at age 66 years and continuing for as long as you are in good health. Consult with your health care provider.  Pap test.** / Every 3 years starting at age 56 years through age 48 or 46 years with 3 consecutive normal Pap tests. Testing can be stopped between 65 and 70 years with 3 consecutive normal Pap tests and no abnormal Pap or HPV tests in the past 10 years.  HPV screening.** / Every 3 years from ages 49 years through ages 35 or 72 years with a history of 3 consecutive normal Pap tests. Testing can be stopped between 65 and 70 years with 3 consecutive normal Pap tests and no abnormal Pap or HPV tests in the past 10 years.  Fecal occult blood test (FOBT) of stool. / Every year beginning at age 48 years and continuing until age 26 years. You may not need to do this test if you get a colonoscopy every 10 years.  Flexible sigmoidoscopy or colonoscopy.** /  Every 5 years for a flexible sigmoidoscopy or every 10 years for a colonoscopy beginning at age 35 years and continuing until age 58 years.  Hepatitis C blood test.** / For all people born from [birth year] through 1965 and any individual with known risks for hepatitis C.  Osteoporosis screening.** / A one-time screening for women ages 65 years and over and women at risk for fractures or osteoporosis.  Skin self-exam. / Monthly.  Influenza vaccine. / Every year.  Tetanus, diphtheria, and acellular pertussis (Tdap/Td) vaccine.** / 1 dose of Td every 10 years.  Varicella vaccine.** / Consult your health care provider.  Zoster vaccine.** / 1 dose for adults aged 67 years or older.  Pneumococcal 13-valent conjugate (PCV13) vaccine.** / Consult your health care provider.  Pneumococcal polysaccharide (PPSV23) vaccine.** / 1 dose for all adults aged 20 years and older.  Meningococcal vaccine.** / Consult your health care  provider.  Hepatitis A vaccine.** / Consult your health care provider.  Hepatitis B vaccine.** / Consult your health care provider.  Haemophilus influenzae type b (Hib) vaccine.** / Consult your health care provider. ** Family history and personal history of risk and conditions may change your health care provider's recommendations.   This information is not intended to replace advice given to you by your health care provider. Make sure you discuss any questions you have with your health care provider.   Document Released: 01/20/2002 Document Revised: 12/15/2014 Document Reviewed: 04/21/2011 Elsevier Interactive Patient Education [employer].

## 2016-09-25 NOTE — Progress Notes (Signed)
Pt. Declines flu shot

## 2016-09-25 NOTE — Progress Notes (Signed)
GYNECOLOGY ANNUAL PREVENTATIVE CARE ENCOUNTER NOTE  Subjective:   Cheryl Martinez is a 40 y.o. [phone] female here for a routine annual gynecologic exam.  Current complaints: none.   Denies abnormal vaginal bleeding, discharge, pelvic pain, problems with intercourse or other gynecologic concerns.    Gynecologic History Patient's last menstrual period was 09/18/2016. Contraception: OCP (estrogen/progesterone) Last Pap: 11/07/2013. Results were: normal  Obstetric History OB History  Gravida Para Term Preterm AB Living  7 3 2 1 3 2   SAB TAB Ectopic Multiple Live Births  3       2    # Outcome Date GA Lbr Len/2nd Weight Sex Delivery Anes PTL Lv  7 Term 03/26/11 7458w2d  8 lb 6 oz (3.799 kg) M Vag-Spont  N LIV     Birth Comments: Shoulder dystocia  6 Term 12/15/99 261w2d  7 lb 2 oz (3.232 kg) F Vag-Spont   LIV  5 Gravida              Birth Comments: System Generated. Please review and update pregnancy details.  4 SAB  8734w0d         3 SAB  6774w0d         2 SAB  452w0d         1 Preterm  4062w0d            Birth Comments: abruption      Past Medical History:  Diagnosis Date  . 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 Degenerative arthritis of left knee 02/2014  . Hypothyroidism   . Knee dislocation 02/2014   left  . 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 Vaginal Pap smear, abnormal     Past Surgical History:  Procedure Laterality Date  . GALLBLADDER SURGERY  1997  . KNEE ARTHROSCOPY WITH PATELLA RECONSTRUCTION Left 02/17/2014   Procedure: LEFT KNEE ARTHROSCOPY WITH LATERAL RELEASE, DEBRIDEMENT/SHAVING (CHONDROPLASTY), SYNOVECTOMY LIMITED WITH RECONSTRUCTION DISLOCATING  PATELLA WITH EXTENSOR REALIGNMENT MUSCLE ADVANCEMENT;  Surgeon: Thera FlakeW D Caffrey Jr., MD;  Location: [facility];  Service: Orthopedics;  Laterality: Left;  . KNEE SURGERY Right 1994    Current Outpatient Prescriptions on File Prior to Visit  Medication Sig Dispense Refill  . fluconazole (DIFLUCAN)  150 MG tablet Take 1 tablet (150 mg total) by mouth once. Can take additional dose three days later if symptoms persist (Patient not taking: Reported on 09/25/2016) 2 tablet 0  . [DISCONTINUED] venlafaxine (EFFEXOR XR) 37.5 MG 24 hr capsule Take 37.5 mg by mouth daily.     No current facility-administered medications on file prior to visit.     Allergies  Allergen Reactions  . Penicillins Itching    Social History   Social History  . Marital status: Married    Spouse name: N/A  . Number of children: N/A  . Years of education: N/A   Occupational History  . Not on file.   Social History Main Topics  . Smoking status: Never Smoker  . Smokeless tobacco: Never Used  . Alcohol use Yes     Comment: occasionally  . Drug use: No  . Sexual activity: Yes    Partners: Male    Birth control/ protection: Condom, Pill   Other Topics Concern  . Not on file   Social History Narrative  . No narrative on file    Family History  Problem Relation Age of Onset  . Diabetes Maternal Grandmother   . Cancer Maternal Grandmother  BREAST    The following portions of the patient's history were reviewed and updated as appropriate: allergies, current medications, past family history, past medical history, past social history, past surgical history and problem list.  Review of Systems Pertinent items noted in HPI and remainder of comprehensive ROS otherwise negative.   Objective:  BP (!) 151/87   Pulse 97   Ht 5\' 4"  (1.626 m)   Wt 222 lb (100.7 kg)   LMP 09/18/2016   BMI 38.11 kg/m  CONSTITUTIONAL: Well-developed, well-nourished female in no acute distress.  HENT:  Normocephalic, atraumatic, External right and left ear normal. Oropharynx is clear and moist EYES: Conjunctivae and EOM are normal. Pupils are equal, round, and reactive to light. No scleral icterus.  NECK: Normal range of motion, supple, no masses.  Normal thyroid.  SKIN: Skin is warm and dry. No rash noted. Not  diaphoretic. No erythema. No pallor. NEUROLOGIC: Alert and oriented to person, place, and time. Normal reflexes, muscle tone coordination. No cranial nerve deficit noted. PSYCHIATRIC: Normal mood and affect. Normal behavior. Normal judgment and thought content. CARDIOVASCULAR: Normal heart rate noted, regular rhythm RESPIRATORY: Clear to auscultation bilaterally. Effort and breath sounds normal, no problems with respiration noted. BREASTS: Symmetric in size. No masses, skin changes, nipple drainage, or lymphadenopathy. ABDOMEN: Soft, normal bowel sounds, no distention noted.  No tenderness, rebound or guarding.  PELVIC: Normal appearing external genitalia; normal appearing vaginal mucosa and cervix.  No abnormal discharge noted.  Pap smear obtained.  Normal uterine size, no other palpable masses, no uterine or adnexal tenderness. MUSCULOSKELETAL: Normal range of motion. No tenderness.  No cyanosis, clubbing, or edema.  2+ distal pulses.   Assessment:  Annual gynecologic examination with pap smear OCP surveillance   Plan:  Will follow up results of pap smear and manage accordingly. OCPs refilled.  Routine preventative health maintenance measures emphasized. Please refer to After Visit Summary for other counseling recommendations.    [name], MD, FACOG Attending Obstetrician & Gynecologist, [facility] for [facility]

## 2016-09-29 LAB — CYTOLOGY - PAP
DIAGNOSIS: NEGATIVE
HPV (WINDOPATH): NOT DETECTED

## 2016-10-02 ENCOUNTER — Telehealth: Payer: Self-pay | Admitting: *Deleted

## 2016-10-02 DIAGNOSIS — Z30011 Encounter for initial prescription of contraceptive pills: Secondary | ICD-10-CM

## 2016-10-02 MED ORDER — ORTHO TRI-CYCLEN LO 0.18/0.215/0.25 MG-25 MCG PO TABS: 1.0000 | ORAL_TABLET | Freq: Every day | ORAL | 5 refills | Status: DC

## 2016-10-02 NOTE — Telephone Encounter (Signed)
-----   Message from Olevia BowensJacinda S Battle sent at 10/02/2016  8:20 AM EDT ----- Regarding: Rx Swap Contact: [phone] Called and states that she needed her birth control prescription to say "DAW" (she needs brand name), they sent her generic instead Uses [facility]

## 2016-10-02 NOTE — Telephone Encounter (Signed)
[facility] with [facility] name only rx.

## 2017-09-04 ENCOUNTER — Other Ambulatory Visit: Payer: Self-pay

## 2017-09-07 ENCOUNTER — Other Ambulatory Visit: Payer: Self-pay

## 2017-10-27 ENCOUNTER — Ambulatory Visit (INDEPENDENT_AMBULATORY_CARE_PROVIDER_SITE_OTHER): Payer: BLUE CROSS/BLUE SHIELD | Admitting: Obstetrics & Gynecology

## 2017-10-27 ENCOUNTER — Encounter: Payer: Self-pay | Admitting: Obstetrics & Gynecology

## 2017-10-27 ENCOUNTER — Ambulatory Visit: Payer: Self-pay | Admitting: Obstetrics & Gynecology

## 2017-10-27 VITALS — BP 132/84 | HR 82 | Wt 222.2 lb

## 2017-10-27 DIAGNOSIS — Z124 Encounter for screening for malignant neoplasm of cervix: Secondary | ICD-10-CM

## 2017-10-27 DIAGNOSIS — Z1151 Encounter for screening for human papillomavirus (HPV): Secondary | ICD-10-CM

## 2017-10-27 DIAGNOSIS — Z01419 Encounter for gynecological examination (general) (routine) without abnormal findings: Secondary | ICD-10-CM | POA: Diagnosis not present

## 2017-10-27 DIAGNOSIS — Z793 Long term (current) use of hormonal contraceptives: Secondary | ICD-10-CM

## 2017-10-27 DIAGNOSIS — Z3202 Encounter for pregnancy test, result negative: Secondary | ICD-10-CM | POA: Diagnosis not present

## 2017-10-27 DIAGNOSIS — Z3041 Encounter for surveillance of contraceptive pills: Secondary | ICD-10-CM

## 2017-10-27 DIAGNOSIS — N912 Amenorrhea, unspecified: Secondary | ICD-10-CM

## 2017-10-27 LAB — POCT URINE PREGNANCY: PREG TEST UR: NEGATIVE

## 2017-10-27 MED ORDER — ORTHO TRI-CYCLEN LO 0.18/0.215/0.25 MG-25 MCG PO TABS: 1.0000 | ORAL_TABLET | Freq: Every day | ORAL | 5 refills | Status: DC

## 2017-10-27 NOTE — Patient Instructions (Addendum)
Thank you for enrolling in MyChart. Please follow the instructions below to securely access your online medical record. MyChart allows you to send messages to your doctor, view your test results, manage appointments, and more.   How Do I Sign Up? 1. In your Internet browser, go to [employer] Address [employer] and enter [email or web address]. 2. Click on the Sign Up Now link in the Sign In box. You will see the New Member Sign Up page. 3. Enter your MyChart Access Code exactly as it appears below. You will not need to use this code after you've completed the sign-up process. If you do not sign up before the expiration date, you must request a new code.  MyChart Access Code: [email or web address] Expires: 12/11/2017  8:27 AM  4. Enter your Social Security Number ([id number]) and Date of Birth (mm/dd/yyyy) as indicated and click Submit. You will be taken to the next sign-up page. 5. Create a MyChart ID. This will be your MyChart login ID and cannot be changed, so think of one that is secure and easy to remember. 6. Create a MyChart password. You can change your password at any time. 7. Enter your Password Reset Question and Answer. This can be used at a later time if you forget your password.  8. Enter your e-mail address. You will receive e-mail notification when new information is available in MyChart. 9. Click Sign Up. You can now view your medical record.   Additional Information Remember, MyChart is NOT to be used for urgent needs. For medical emergencies, dial 911.

## 2017-10-27 NOTE — Progress Notes (Signed)
GYNECOLOGY ANNUAL PREVENTATIVE CARE ENCOUNTER NOTE  Subjective:   Cheryl Martinez is a 41 y.o. [phone] female here for a routine annual gynecologic exam.  Current complaints: missed period on OCPs.  Usually gets one day of withdrawal bleeding per month, but missed this month. Desires pregnancy test.   Denies abnormal vaginal bleeding, discharge, pelvic pain, problems with intercourse or other gynecologic concerns.    Gynecologic History No LMP recorded. Contraception: OCP (estrogen/progesterone) Last Pap: 09/25/2016. Results were: normal with negative HPV.  Obstetric History OB History  Gravida Para Term Preterm AB Living  7 3 2 1 3 2   SAB TAB Ectopic Multiple Live Births  3       2    # Outcome Date GA Lbr Len/2nd Weight Sex Delivery Anes PTL Lv  7 Term 03/26/11 7023w2d  8 lb 6 oz (3.799 kg) M Vag-Spont  N LIV     Birth Comments: Shoulder dystocia  6 Term 12/15/99 4549w2d  7 lb 2 oz (3.232 kg) F Vag-Spont   LIV  5 Gravida              Birth Comments: System Generated. Please review and update pregnancy details.  4 SAB  6491w0d         3 SAB  6768w0d         2 SAB  1172w0d         1 Preterm  6518w0d            Birth Comments: abruption      Past Medical History:  Diagnosis Date  . 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 Degenerative arthritis of left knee 02/2014  . Hypothyroidism   . Knee dislocation 02/2014   left  . 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 Vaginal Pap smear, abnormal     Past Surgical History:  Procedure Laterality Date  . GALLBLADDER SURGERY  1997  . KNEE SURGERY Right 1994  . LEFT KNEE ARTHROSCOPY WITH LATERAL RELEASE, DEBRIDEMENT/SHAVING (CHONDROPLASTY), SYNOVECTOMY LIMITED WITH RECONSTRUCTION DISLOCATING  PATELLA WITH EXTENSOR REALIGNMENT MUSCLE ADVANCEMENT Left 02/17/2014   [facility]formed [facility] The[facility] Flakeaffrey, W D [facility]    Current Outpatient Medications on File Prior to Visit  Medication Sig Dispense Refill  . levothyroxine  (SYNTHROID, LEVOTHROID) 75 MCG tablet Take by mouth.    . [DISCONTINUED] venlafaxine (EFFEXOR XR) 37.5 MG 24 hr capsule Take 37.5 mg by mouth daily.     No current facility-administered medications on file prior to visit.     Allergies  Allergen Reactions  . Penicillins Itching    Social History   Socioeconomic History  . Marital status: Married    Spouse name: Not on file  . Number of children: Not on file  . Years of education: Not on file  . Highest education level: Not on file  Social Needs  . Financial resource strain: Not on file  . Food insecurity - worry: Not on file  . Food insecurity - inability: Not on file  . Transportation needs - medical: Not on file  . Transportation needs - non-medical: Not on file  Occupational History  . Not on file  Tobacco Use  . Smoking status: Never Smoker  . Smokeless tobacco: Never Used  Substance and Sexual Activity  . Alcohol use: Yes    Comment: occasionally  . Drug use: No  . Sexual activity: Yes    Partners: Male    Birth control/protection: Condom, Pill  Other Topics Concern  . Not on file  Social History Narrative  . Not on file    Family History  Problem Relation Age of Onset  . Diabetes Maternal Grandmother   . Cancer Maternal Grandmother        BREAST    The following portions of the patient's history were reviewed and updated as appropriate: allergies, current medications, past family history, past medical history, past social history, past surgical history and problem list.  Review of Systems Pertinent items noted in HPI and remainder of comprehensive ROS otherwise negative.   Objective:  BP 132/84   Pulse 82   Wt 222 lb 3.2 oz (100.8 kg)   BMI 38.14 kg/m  CONSTITUTIONAL: Well-developed, well-nourished female in no acute distress.  HENT:  Normocephalic, atraumatic, External right and left ear normal. Oropharynx is clear and moist EYES: Conjunctivae and EOM are normal. Pupils are equal, round, and  reactive to light. No scleral icterus.  NECK: Normal range of motion, supple, no masses.  Normal thyroid.  SKIN: Skin is warm and dry. No rash noted. Not diaphoretic. No erythema. No pallor. NEUROLOGIC: Alert and oriented to person, place, and time. Normal reflexes, muscle tone coordination. No cranial nerve deficit noted. PSYCHIATRIC: Normal mood and affect. Normal behavior. Normal judgment and thought content. CARDIOVASCULAR: Normal heart rate noted, regular rhythm RESPIRATORY: Clear to auscultation bilaterally. Effort and breath sounds normal, no problems with respiration noted. BREASTS: Symmetric in size. No masses, skin changes, nipple drainage, or lymphadenopathy. ABDOMEN: Soft, normal bowel sounds, no distention noted.  No tenderness, rebound or guarding.  PELVIC: Normal appearing external genitalia; normal appearing vaginal mucosa and cervix.  No abnormal discharge noted.  Pap smear obtained.  Normal uterine size, no other palpable masses, no uterine or adnexal tenderness. MUSCULOSKELETAL: Normal range of motion. No tenderness.  No cyanosis, clubbing, or edema.  2+ distal pulses.   Assessment and Plan:  1. Encounter for gynecological examination with Papanicolaou smear of cervix - Cytology - PAP Will follow up results of pap smear and manage accordingly.  2. Amenorrhea due to oral contraceptive - POCT urine pregnancy negative. Reassured that missed periods can occur occasionally with OCPs, especially given she only had one day of withdrawal bleeding. Will continue to monitor closely.     3. Uses oral contraception OCPs refilled. - ORTHO TRI-CYCLEN LO 0.18/0.215/0.25 MG-25 MCG tab; Take 1 tablet daily by mouth.  Dispense: 3 Package; Refill: 5  Routine preventative health maintenance measures emphasized. Please refer to After Visit Summary for other counseling recommendations.    Jaynie CollinsUGONNA  Mishka Stegemann, MD, FACOG Attending Obstetrician & Gynecologist, [facility]

## 2017-10-27 NOTE — Progress Notes (Signed)
UPT abnormal cycle

## 2017-10-30 LAB — CYTOLOGY - PAP
Diagnosis: NEGATIVE
HPV: NOT DETECTED

## 2017-11-09 ENCOUNTER — Other Ambulatory Visit: Payer: Self-pay

## 2017-11-09 DIAGNOSIS — Z3041 Encounter for surveillance of contraceptive pills: Secondary | ICD-10-CM

## 2017-11-09 MED ORDER — ORTHO TRI-CYCLEN LO 0.18/0.215/0.25 MG-25 MCG PO TABS: 1.0000 | ORAL_TABLET | Freq: Every day | ORAL | 5 refills | Status: DC

## 2017-11-09 NOTE — Telephone Encounter (Signed)
Refill on birth control sent [facility] [place]

## 2017-11-20 ENCOUNTER — Encounter: Payer: Self-pay | Admitting: Podiatry

## 2017-11-20 ENCOUNTER — Ambulatory Visit (INDEPENDENT_AMBULATORY_CARE_PROVIDER_SITE_OTHER): Payer: BLUE CROSS/BLUE SHIELD | Admitting: Podiatry

## 2017-11-20 DIAGNOSIS — L600 Ingrowing nail: Secondary | ICD-10-CM

## 2017-11-20 NOTE — Progress Notes (Signed)
   Subjective:    Patient ID: Cheryl Martinez, female    DOB: Jun 25, 1976, 41 y.o.   MRN: [id number]  HPI    Review of Systems     Objective:   Physical Exam        Assessment & Plan:

## 2017-11-22 NOTE — Progress Notes (Signed)
   Subjective: Patient presents today for evaluation of pain to the lateral border of the right great toe that has been present for the past 2 weeks. Patient is concerned for possible ingrown nail.  She states the nail initially cracked so she clipped it off and is still experiencing soreness.  Patient presents today for further treatment and evaluation.   Past Medical History:  Diagnosis Date  . 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 Degenerative arthritis of left knee 02/2014  . Hypothyroidism   . Knee dislocation 02/2014   left  . 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 Vaginal Pap smear, abnormal     Objective:  General: Well developed, nourished, in no acute distress, alert and oriented x3   Dermatology: Skin is warm, dry and supple bilateral.  Lateral border of the right great toe appears to be erythematous with evidence of an ingrowing nail. Pain on palpation noted to the border of the nail fold. The remaining nails appear unremarkable at this time. There are no open sores, lesions.  Vascular: Dorsalis Pedis artery and Posterior Tibial artery pedal pulses palpable. No lower extremity edema noted.   Neruologic: Grossly intact via light touch bilateral.  Musculoskeletal: Muscular strength within normal limits in all groups bilateral. Normal range of motion noted to all pedal and ankle joints.   Assesement: #1 Paronychia with ingrowing nail lateral border of the right great toe #2 Pain in toe #3 Incurvated nail  Plan of Care:  1. Patient evaluated.  2. Discussed treatment alternatives and plan of care. Explained nail avulsion procedure and post procedure course to patient. 3. Patient opted for permanent partial nail avulsion.  4. Prior to procedure, local anesthesia infiltration utilized using 3 ml of a 50:50 mixture of 2% plain lidocaine and 0.5% plain marcaine in a normal hallux block fashion and a betadine prep performed.  5. Partial permanent nail avulsion with chemical  matrixectomy performed using 3x30sec applications of phenol followed by alcohol flush.  6. Light dressing applied. 7. Return to clinic in 2 weeks.   Felecia ShellingBrent M. Evans, DPM [facility]  Dr. Felecia ShellingBrent M. Evans, DPM    [address]                                        [place], [place] [address]                Office [phone]  Fax [phone]

## 2017-12-04 ENCOUNTER — Ambulatory Visit (INDEPENDENT_AMBULATORY_CARE_PROVIDER_SITE_OTHER): Payer: BLUE CROSS/BLUE SHIELD | Admitting: Podiatry

## 2017-12-04 ENCOUNTER — Encounter: Payer: Self-pay | Admitting: Podiatry

## 2017-12-04 DIAGNOSIS — L600 Ingrowing nail: Secondary | ICD-10-CM

## 2017-12-04 MED ORDER — GENTAMICIN SULFATE 0.1 % EX CREA: 1.0000 "application " | TOPICAL_CREAM | Freq: Three times a day (TID) | CUTANEOUS | 1 refills | Status: DC

## 2017-12-10 NOTE — Progress Notes (Signed)
   Subjective: Patient presents today 2 weeks post ingrown nail permanent nail avulsion procedure of the medial border of the right great toe.  She reports continued soreness and draining.  Wearing shoes increases the pain.  She is soaking the foot and applying antibiotic ointment for treatment.   Past Medical History:  Diagnosis Date  . 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 Degenerative arthritis of left knee 02/2014  . Hypothyroidism   . Knee dislocation 02/2014   left  . 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 Vaginal Pap smear, abnormal     Objective: Skin is warm, dry and supple. Nail and respective nail fold appears to be healing appropriately. Open wound to the associated nail fold with a granular wound base and moderate amount of fibrotic tissue. Minimal drainage noted. Mild erythema around the periungual region likely due to phenol chemical matricectomy.  Assessment: #1 postop permanent partial nail avulsion medial border of the right great toe #2 open wound periungual nail fold of respective digit.   Plan of care: #1 patient was evaluated  #2 debridement of open wound was performed to the periungual border of the respective toe using a currette. Antibiotic ointment and Band-Aid was applied. #3  Prescription for gentamicin cream provided to patient. #4  Return to clinic as needed.   Felecia ShellingBrent M. Evans, DPM [facility]  Dr. Felecia ShellingBrent M. Evans, DPM    [address]                                        [place], [place] [address]                Office [phone]  Fax [phone]

## 2018-03-23 ENCOUNTER — Other Ambulatory Visit: Payer: Self-pay | Admitting: Obstetrics & Gynecology

## 2018-03-23 DIAGNOSIS — Z30011 Encounter for initial prescription of contraceptive pills: Secondary | ICD-10-CM

## 2018-05-17 ENCOUNTER — Other Ambulatory Visit: Payer: Self-pay

## 2018-05-17 NOTE — Telephone Encounter (Signed)
Patient is requesting a refill on birth control pills.  

## 2018-05-28 ENCOUNTER — Other Ambulatory Visit: Payer: Self-pay

## 2018-05-28 DIAGNOSIS — Z30011 Encounter for initial prescription of contraceptive pills: Secondary | ICD-10-CM

## 2018-05-28 MED ORDER — NORGESTIM-ETH ESTRAD TRIPHASIC 0.18/0.215/0.25 MG-25 MCG PO TABS: 1.0000 | ORAL_TABLET | Freq: Every day | ORAL | 5 refills | Status: DC

## 2018-05-28 NOTE — Telephone Encounter (Signed)
Refill on birth control 

## 2018-08-17 DIAGNOSIS — E039 Hypothyroidism, unspecified: Secondary | ICD-10-CM | POA: Diagnosis not present

## 2018-08-17 DIAGNOSIS — J029 Acute pharyngitis, unspecified: Secondary | ICD-10-CM | POA: Diagnosis not present

## 2018-08-17 DIAGNOSIS — J0100 Acute maxillary sinusitis, unspecified: Secondary | ICD-10-CM | POA: Diagnosis not present

## 2018-09-06 ENCOUNTER — Encounter: Payer: Self-pay | Admitting: Radiology

## 2018-10-21 ENCOUNTER — Ambulatory Visit: Payer: Self-pay | Admitting: Obstetrics & Gynecology

## 2018-11-02 ENCOUNTER — Encounter: Payer: Self-pay | Admitting: Obstetrics & Gynecology

## 2018-11-02 ENCOUNTER — Ambulatory Visit (INDEPENDENT_AMBULATORY_CARE_PROVIDER_SITE_OTHER): Payer: 59 | Admitting: Obstetrics & Gynecology

## 2018-11-02 ENCOUNTER — Other Ambulatory Visit: Payer: Self-pay | Admitting: Obstetrics & Gynecology

## 2018-11-02 VITALS — BP 133/81 | HR 68 | Ht 64.0 in | Wt 230.0 lb

## 2018-11-02 DIAGNOSIS — Z1231 Encounter for screening mammogram for malignant neoplasm of breast: Secondary | ICD-10-CM

## 2018-11-02 DIAGNOSIS — Z01419 Encounter for gynecological examination (general) (routine) without abnormal findings: Secondary | ICD-10-CM | POA: Diagnosis not present

## 2018-11-02 NOTE — Patient Instructions (Signed)
Preventive Care 40-64 Years, Female Preventive care refers to lifestyle choices and visits with your health care provider that can promote health and wellness. What does preventive care include?  A yearly physical exam. This is also called an annual well check.  Dental exams once or twice a year.  Routine eye exams. Ask your health care provider how often you should have your eyes checked.  Personal lifestyle choices, including: ? Daily care of your teeth and gums. ? Regular physical activity. ? Eating a healthy diet. ? Avoiding tobacco and drug use. ? Limiting alcohol use. ? Practicing safe sex. ? Taking low-dose aspirin daily starting at age 58. ? Taking vitamin and mineral supplements as recommended by your health care provider. What happens during an annual well check? The services and screenings done by your health care provider during your annual well check will depend on your age, overall health, lifestyle risk factors, and family history of disease. Counseling Your health care provider may ask you questions about your:  Alcohol use.  Tobacco use.  Drug use.  Emotional well-being.  Home and relationship well-being.  Sexual activity.  Eating habits.  Work and work Statistician.  Method of birth control.  Menstrual cycle.  Pregnancy history.  Screening You may have the following tests or measurements:  Height, weight, and BMI.  Blood pressure.  Lipid and cholesterol levels. These may be checked every 5 years, or more frequently if you are over 81 years old.  Skin check.  Lung cancer screening. You may have this screening every year starting at age 78 if you have a 30-pack-year history of smoking and currently smoke or have quit within the past 15 years.  Fecal occult blood test (FOBT) of the stool. You may have this test every year starting at age 65.  Flexible sigmoidoscopy or colonoscopy. You may have a sigmoidoscopy every 5 years or a colonoscopy  every 10 years starting at age 30.  Hepatitis C blood test.  Hepatitis B blood test.  Sexually transmitted disease (STD) testing.  Diabetes screening. This is done by checking your blood sugar (glucose) after you have not eaten for a while (fasting). You may have this done every 1-3 years.  Mammogram. This may be done every 1-2 years. Talk to your health care provider about when you should start having regular mammograms. This may depend on whether you have a family history of breast cancer.  BRCA-related cancer screening. This may be done if you have a family history of breast, ovarian, tubal, or peritoneal cancers.  Pelvic exam and Pap test. This may be done every 3 years starting at age 80. Starting at age 36, this may be done every 5 years if you have a Pap test in combination with an HPV test.  Bone density scan. This is done to screen for osteoporosis. You may have this scan if you are at high risk for osteoporosis.  Discuss your test results, treatment options, and if necessary, the need for more tests with your health care provider. Vaccines Your health care provider may recommend certain vaccines, such as:  Influenza vaccine. This is recommended every year.  Tetanus, diphtheria, and acellular pertussis (Tdap, Td) vaccine. You may need a Td booster every 10 years.  Varicella vaccine. You may need this if you have not been vaccinated.  Zoster vaccine. You may need this after age 5.  Measles, mumps, and rubella (MMR) vaccine. You may need at least one dose of MMR if you were born in  1955 or later. You may also need a second dose.  Pneumococcal 13-valent conjugate (PCV13) vaccine. You may need this if you have certain conditions and were not previously vaccinated.  Pneumococcal polysaccharide (PPSV23) vaccine. You may need one or two doses if you smoke cigarettes or if you have certain conditions.  Meningococcal vaccine. You may need this if you have certain  conditions.  Hepatitis A vaccine. You may need this if you have certain conditions or if you travel or work in places where you may be exposed to hepatitis A.  Hepatitis B vaccine. You may need this if you have certain conditions or if you travel or work in places where you may be exposed to hepatitis B.  Haemophilus influenzae type b (Hib) vaccine. You may need this if you have certain conditions.  Talk to your health care provider about which screenings and vaccines you need and how often you need them. This information is not intended to replace advice given to you by your health care provider. Make sure you discuss any questions you have with your health care provider. Document Released: 12/21/2015 Document Revised: 08/13/2016 Document Reviewed: 09/25/2015 Elsevier Interactive Patient Education  [employer].

## 2018-11-02 NOTE — Progress Notes (Signed)
GYNECOLOGY ANNUAL PREVENTATIVE CARE ENCOUNTER NOTE  Subjective:   Cheryl Martinez is a 42 y.o. [phone] female here for a routine annual gynecologic exam.  Current complaints: none.   Denies abnormal vaginal bleeding, discharge, pelvic pain, problems with intercourse or other gynecologic concerns.    Gynecologic History Patient's last menstrual period was 10/19/2018 (within days). Contraception: OCP (estrogen/progesterone) Last Pap: 10/27/2017. Results were: normal with negative HPV  Obstetric History OB History  Gravida Para Term Preterm AB Living  7 3 2 1 3 2   SAB TAB Ectopic Multiple Live Births  3       2    # Outcome Date GA Lbr Len/2nd Weight Sex Delivery Anes PTL Lv  7 Term 03/26/11 [redacted]w[redacted]d  8 lb 6 oz (3.799 kg) M Vag-Spont  N LIV     Birth Comments: Shoulder dystocia  6 Term 12/15/99 [redacted]w[redacted]d  7 lb 2 oz (3.232 kg) F Vag-Spont   LIV  5 Gravida              Birth Comments: System Generated. Please review and update pregnancy details.  4 SAB  [redacted]w[redacted]d         3 SAB  [redacted]w[redacted]d         2 SAB  [redacted]w[redacted]d         1 Preterm  [redacted]w[redacted]d            Birth Comments: abruption    Past Medical History:  Diagnosis Date  . 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 Degenerative arthritis of left knee 02/2014  . Hypothyroidism   . Knee dislocation 02/2014   left  . 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 Vaginal Pap smear, abnormal     Past Surgical History:  Procedure Laterality Date  . GALLBLADDER SURGERY  1997  . KNEE ARTHROSCOPY WITH PATELLA RECONSTRUCTION Left 02/17/2014   Procedure: LEFT KNEE ARTHROSCOPY WITH LATERAL RELEASE, DEBRIDEMENT/SHAVING (CHONDROPLASTY), SYNOVECTOMY LIMITED WITH RECONSTRUCTION DISLOCATING  PATELLA WITH EXTENSOR REALIGNMENT MUSCLE ADVANCEMENT;  Surgeon: Thera Flake., MD;  Location: [facility];  Service: Orthopedics;  Laterality: Left;  . KNEE SURGERY Right 1994    Current Outpatient Medications on File Prior to Visit  Medication Sig Dispense Refill   . levothyroxine (SYNTHROID, LEVOTHROID) 75 MCG tablet Take by mouth.    . Norgestimate-Ethinyl Estradiol Triphasic 0.18/0.215/0.25 MG-25 MCG tab Take by mouth.    Marland Kitchen gentamicin cream (GARAMYCIN) 0.1 % Apply 1 application topically 3 (three) times daily. (Patient not taking: Reported on 11/02/2018) 30 g 1  . Norgestimate-Ethinyl Estradiol Triphasic (TRI-LO-MARZIA) 0.18/0.215/0.25 MG-25 MCG tab Take 1 tablet by mouth daily. (Patient not taking: Reported on 11/02/2018) 84 tablet 5  . ORTHO TRI-CYCLEN LO 0.18/0.215/0.25 MG-25 MCG tab Take 1 tablet by mouth daily. (Patient not taking: Reported on 11/02/2018) 3 Package 5  . [DISCONTINUED] venlafaxine (EFFEXOR XR) 37.5 MG 24 hr capsule Take 37.5 mg by mouth daily.     No current facility-administered medications on file prior to visit.     Allergies  Allergen Reactions  . Penicillins Itching    Social History:  reports that she has never smoked. She has never used smokeless tobacco. She reports that she drinks alcohol. She reports that she does not use drugs.  Family History  Problem Relation Age of Onset  . Diabetes Maternal Grandmother   . Cancer Maternal Grandmother        BREAST    The following portions of the patient's history were  reviewed and updated as appropriate: allergies, current medications, past family history, past medical history, past social history, past surgical history and problem list.  Review of Systems Pertinent items noted in HPI and remainder of comprehensive ROS otherwise negative.   Objective:  BP 133/81   Pulse 68   Ht 5\' 4"  (1.626 m)   Wt 230 lb (104.3 kg)   LMP 10/19/2018 (Within Days)   BMI 39.48 kg/m  CONSTITUTIONAL: Well-developed, well-nourished female in no acute distress.  HENT:  Normocephalic, atraumatic, External right and left ear normal. Oropharynx is clear and moist EYES: Conjunctivae and EOM are normal. Pupils are equal, round, and reactive to light. No scleral icterus.  NECK: Normal range  of motion, supple, no masses.  Normal thyroid.  SKIN: Skin is warm and dry. No rash noted. Not diaphoretic. No erythema. No pallor. NEUROLOGIC: Alert and oriented to person, place, and time. Normal reflexes, muscle tone coordination. No cranial nerve deficit noted. PSYCHIATRIC: Normal mood and affect. Normal behavior. Normal judgment and thought content. CARDIOVASCULAR: Normal heart rate noted, regular rhythm RESPIRATORY: Clear to auscultation bilaterally. Effort and breath sounds normal, no problems with respiration noted. BREASTS: Symmetric in size. No masses, skin changes, nipple drainage, or lymphadenopathy. ABDOMEN: Soft, normal bowel sounds, no distention noted.  No tenderness, rebound or guarding.  PELVIC: Deferred MUSCULOSKELETAL: Normal range of motion. No tenderness.  No cyanosis, clubbing, or edema.  2+ distal pulses.   Assessment and Plan:  1. Breast cancer screening by mammogram Mammogram scheduled - MM 3D SCREEN BREAST BILATERAL; Future  2. Well woman exam Up to date on cervical cancer screening Routine preventative health maintenance measures emphasized. Please refer to After Visit Summary for other counseling recommendations.    Jaynie CollinsUGONNA  Keyonte Cookston, MD, FACOG Obstetrician & Gynecologist, [facility], [facility]

## 2018-11-08 ENCOUNTER — Ambulatory Visit
Admission: RE | Admit: 2018-11-08 | Discharge: 2018-11-08 | Disposition: A | Discharge status: Home / Self Care | Payer: 59 | Source: Ambulatory Visit | Attending: Obstetrics & Gynecology | Admitting: Obstetrics & Gynecology

## 2018-11-08 DIAGNOSIS — Z1231 Encounter for screening mammogram for malignant neoplasm of breast: Secondary | ICD-10-CM

## 2019-01-04 DIAGNOSIS — E221 Hyperprolactinemia: Secondary | ICD-10-CM | POA: Diagnosis not present

## 2019-01-04 DIAGNOSIS — R7301 Impaired fasting glucose: Secondary | ICD-10-CM | POA: Diagnosis not present

## 2019-01-04 DIAGNOSIS — E039 Hypothyroidism, unspecified: Secondary | ICD-10-CM | POA: Diagnosis not present

## 2019-01-04 DIAGNOSIS — Z0000 Encounter for general adult medical examination without abnormal findings: Secondary | ICD-10-CM | POA: Diagnosis not present

## 2019-01-11 DIAGNOSIS — E039 Hypothyroidism, unspecified: Secondary | ICD-10-CM | POA: Diagnosis not present

## 2019-01-11 DIAGNOSIS — R7301 Impaired fasting glucose: Secondary | ICD-10-CM | POA: Diagnosis not present

## 2019-01-11 DIAGNOSIS — E221 Hyperprolactinemia: Secondary | ICD-10-CM | POA: Diagnosis not present

## 2019-01-13 DIAGNOSIS — E039 Hypothyroidism, unspecified: Secondary | ICD-10-CM | POA: Diagnosis not present

## 2019-01-13 DIAGNOSIS — R7301 Impaired fasting glucose: Secondary | ICD-10-CM | POA: Diagnosis not present

## 2019-01-13 DIAGNOSIS — Z0000 Encounter for general adult medical examination without abnormal findings: Secondary | ICD-10-CM | POA: Diagnosis not present

## 2019-01-18 ENCOUNTER — Other Ambulatory Visit: Payer: Self-pay | Admitting: Obstetrics and Gynecology

## 2019-01-18 DIAGNOSIS — Z30011 Encounter for initial prescription of contraceptive pills: Secondary | ICD-10-CM

## 2019-01-18 MED ORDER — NORGESTIM-ETH ESTRAD TRIPHASIC 0.18/0.215/0.25 MG-25 MCG PO TABS: 1.0000 | ORAL_TABLET | Freq: Every day | ORAL | 4 refills | Status: DC

## 2019-01-24 ENCOUNTER — Telehealth: Payer: Self-pay | Admitting: General Practice

## 2019-01-24 NOTE — Telephone Encounter (Signed)
Alyssa from [employer] called and left message on nurse voicemail line stating she is calling about the tri-lo-marzia Rx. She states if we contact them for a prior auth on this and state it is for prevention the medication will be covered with no cost to the patient. She did not leave a call back number.

## 2019-02-01 ENCOUNTER — Other Ambulatory Visit: Payer: Self-pay | Admitting: Obstetrics & Gynecology

## 2019-02-01 DIAGNOSIS — Z30011 Encounter for initial prescription of contraceptive pills: Secondary | ICD-10-CM

## 2019-02-01 MED ORDER — NORGESTIM-ETH ESTRAD TRIPHASIC 0.18/0.215/0.25 MG-25 MCG PO TABS: 1.0000 | ORAL_TABLET | Freq: Every day | ORAL | 11 refills | Status: DC

## 2019-02-03 ENCOUNTER — Telehealth: Payer: Self-pay | Admitting: *Deleted

## 2019-02-03 NOTE — Telephone Encounter (Signed)
Ethelene Browns from [employer] left a voicemessage that he is calling re: this patient. States she is having issues getting her birth control filled. States she needs Sprintec not Tri-Lo-Sprintec because Sprintec is covered at a $0 copay. May call to discuss.  Per chart review is [facility] patient - will route to that office.

## 2019-02-08 ENCOUNTER — Other Ambulatory Visit: Payer: Self-pay | Admitting: *Deleted

## 2019-02-08 MED ORDER — NORGESTIM-ETH ESTRAD TRIPHASIC 0.18/0.215/0.25 MG-25 MCG PO TABS: 1.0000 | ORAL_TABLET | Freq: Every day | ORAL | 11 refills | Status: DC

## 2019-04-04 DIAGNOSIS — E6609 Other obesity due to excess calories: Secondary | ICD-10-CM | POA: Diagnosis not present

## 2019-04-04 DIAGNOSIS — L309 Dermatitis, unspecified: Secondary | ICD-10-CM | POA: Diagnosis not present

## 2019-04-04 DIAGNOSIS — B49 Unspecified mycosis: Secondary | ICD-10-CM | POA: Diagnosis not present

## 2019-09-05 ENCOUNTER — Other Ambulatory Visit: Payer: Self-pay | Admitting: Obstetrics & Gynecology

## 2019-09-26 ENCOUNTER — Other Ambulatory Visit: Payer: Self-pay | Admitting: Obstetrics & Gynecology

## 2019-09-26 DIAGNOSIS — Z1231 Encounter for screening mammogram for malignant neoplasm of breast: Secondary | ICD-10-CM

## 2019-11-07 ENCOUNTER — Other Ambulatory Visit: Payer: Self-pay

## 2019-11-07 ENCOUNTER — Encounter: Payer: Self-pay | Admitting: Obstetrics & Gynecology

## 2019-11-07 ENCOUNTER — Ambulatory Visit (INDEPENDENT_AMBULATORY_CARE_PROVIDER_SITE_OTHER): Payer: 59 | Admitting: Obstetrics & Gynecology

## 2019-11-07 VITALS — BP 166/96 | HR 97 | Temp 98.2°F | Ht 64.0 in | Wt 224.0 lb

## 2019-11-07 DIAGNOSIS — Z01419 Encounter for gynecological examination (general) (routine) without abnormal findings: Secondary | ICD-10-CM

## 2019-11-07 DIAGNOSIS — Z23 Encounter for immunization: Secondary | ICD-10-CM | POA: Diagnosis not present

## 2019-11-07 DIAGNOSIS — Z30011 Encounter for initial prescription of contraceptive pills: Secondary | ICD-10-CM

## 2019-11-07 DIAGNOSIS — Z1151 Encounter for screening for human papillomavirus (HPV): Secondary | ICD-10-CM | POA: Diagnosis not present

## 2019-11-07 DIAGNOSIS — Z124 Encounter for screening for malignant neoplasm of cervix: Secondary | ICD-10-CM

## 2019-11-07 MED ORDER — NORGESTIM-ETH ESTRAD TRIPHASIC 0.18/0.215/0.25 MG-25 MCG PO TABS: 1.0000 | ORAL_TABLET | Freq: Every day | ORAL | 10 refills | Status: DC

## 2019-11-07 NOTE — Patient Instructions (Signed)

## 2019-11-07 NOTE — Progress Notes (Signed)
GYNECOLOGY ANNUAL PREVENTATIVE CARE ENCOUNTER NOTE  History:     Cheryl Martinez is a 43 y.o. [phone] female here for a routine annual gynecologic exam.  Current complaints: none.  Desires Tdap vaccination and OCP refill.   Denies abnormal vaginal bleeding, discharge, pelvic pain, problems with intercourse or other gynecologic concerns.    Gynecologic History Patient's last menstrual period was 10/08/2019 (approximate). Contraception: OCP (estrogen/progesterone) Last Pap: 10/27/2017. Results were: normal with negative HPV Last mammogram: 11/08/2018. Results were: normal  Obstetric History OB History  Gravida Para Term Preterm AB Living  7 3 2 1 3 2   SAB TAB Ectopic Multiple Live Births  3       2    # Outcome Date GA Lbr Len/2nd Weight Sex Delivery Anes PTL Lv  7 Term 03/26/11 [redacted]w[redacted]d  8 lb 6 oz (3.799 kg) M Vag-Spont  N LIV     Birth Comments: Shoulder dystocia  6 Term 12/15/99 [redacted]w[redacted]d  7 lb 2 oz (3.232 kg) F Vag-Spont   LIV  5 Gravida              Birth Comments: System Generated. Please review and update pregnancy details.  4 SAB  [redacted]w[redacted]d         3 SAB  [redacted]w[redacted]d         2 SAB  [redacted]w[redacted]d         1 Preterm  [redacted]w[redacted]d            Birth Comments: abruption    Past Medical History:  Diagnosis Date  . 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 Degenerative arthritis of left knee 02/2014  . Hypothyroidism   . Knee dislocation 02/2014   left  . 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 Vaginal Pap smear, abnormal     Past Surgical History:  Procedure Laterality Date  . [place]  . KNEE ARTHROSCOPY WITH PATELLA RECONSTRUCTION Left 02/17/2014   Procedure: LEFT KNEE ARTHROSCOPY WITH LATERAL RELEASE, DEBRIDEMENT/SHAVING (CHONDROPLASTY), SYNOVECTOMY LIMITED WITH RECONSTRUCTION DISLOCATING  PATELLA WITH EXTENSOR REALIGNMENT MUSCLE ADVANCEMENT;  Surgeon: Yvette Rack., MD;  Location: [place] [facility];  Service: Orthopedics;  Laterality: Left;  . KNEE SURGERY Right 1994    Current Outpatient Medications on File Prior to Visit  Medication Sig Dispense Refill  . levothyroxine (SYNTHROID, LEVOTHROID) 75 MCG tablet Take by mouth.    . [DISCONTINUED] venlafaxine (EFFEXOR XR) 37.5 MG 24 hr capsule Take 37.5 mg by mouth daily.     No current facility-administered medications on file prior to visit.     Allergies  Allergen Reactions  . Penicillins Itching    Social History:  reports that she has never smoked. She has never used smokeless tobacco. She reports current alcohol use. She reports that she does not use drugs.  Family History  Problem Relation Age of Onset  . Diabetes Maternal Grandmother   . Cancer Maternal Grandmother        BREAST  . Breast cancer Maternal Grandmother     The following portions of the patient's history were reviewed and updated as appropriate: allergies, current medications, past family history, past medical history, past social history, past surgical history and problem list.  Review of Systems Pertinent items noted in HPI and remainder of comprehensive ROS otherwise negative.  Physical Exam:  BP (!) 166/96 (BP Location: Right Arm, Patient Position: Sitting, Cuff Size: Large)   Pulse 97   Temp 98.2 F (36.8 C) (Oral)  Ht 5\' 4"  (1.626 m)   Wt 224 lb (101.6 kg)   LMP 10/08/2019 (Approximate)   BMI 38.45 kg/m  CONSTITUTIONAL: Well-developed, well-nourished female in no acute distress.  HENT:  Normocephalic, atraumatic, External right and left ear normal. Oropharynx is clear and moist EYES: Conjunctivae and EOM are normal. Pupils are equal, round, and reactive to light. No scleral icterus.  NECK: Normal range of motion, supple, no masses.  Normal thyroid.  SKIN: Skin is warm and dry. No rash noted. Not diaphoretic. No erythema. No pallor. MUSCULOSKELETAL: Normal range of motion. No tenderness.  No cyanosis, clubbing, or edema.  2+ distal pulses. NEUROLOGIC: Alert and oriented to person, place, and time. Normal reflexes,  muscle tone coordination.  PSYCHIATRIC: Normal mood and affect. Normal behavior. Normal judgment and thought content. CARDIOVASCULAR: Normal heart rate noted, regular rhythm RESPIRATORY: Clear to auscultation bilaterally. Effort and breath sounds normal, no problems with respiration noted. BREASTS: Symmetric in size. No masses, tenderness, skin changes, nipple drainage, or lymphadenopathy bilaterally. ABDOMEN: Soft, no distention noted.  No tenderness, rebound or guarding.  PELVIC: Normal appearing external genitalia and urethral meatus; normal appearing vaginal mucosa and cervix.  No abnormal discharge noted.  Pap smear obtained.  Normal uterine size, no other palpable masses, no uterine or adnexal tenderness.   Assessment and Plan:    1. Visit for oral contraceptive prescription Refill prescribed.  - Norgestimate-Ethinyl Estradiol Triphasic (TRI-LO-MARZIA) 0.18/0.215/0.25 MG-25 MCG tab; Take 1 tablet by mouth daily.  Dispense: 84 tablet; Refill: 10  2. Need for tetanus, diphtheria, and acellular pertussis (Tdap) vaccine in patient of adolescent age or older - Tdap vaccine greater than or equal to 7yo IM given  3. Well woman exam with routine gynecological exam - Cytology - PAP( [place]) Will follow up results of pap smear and manage accordingly. Mammogram scheduled Routine preventative health maintenance measures emphasized. Please refer to After Visit Summary for other counseling recommendations.      01-05-1982, MD, FACOG Obstetrician & Gynecologist, [facility] for [facility] '

## 2019-11-11 ENCOUNTER — Ambulatory Visit
Admission: RE | Admit: 2019-11-11 | Discharge: 2019-11-11 | Disposition: A | Discharge status: Home / Self Care | Payer: 59 | Source: Ambulatory Visit | Attending: Obstetrics & Gynecology | Admitting: Obstetrics & Gynecology

## 2019-11-11 ENCOUNTER — Other Ambulatory Visit: Payer: Self-pay

## 2019-11-11 DIAGNOSIS — Z1231 Encounter for screening mammogram for malignant neoplasm of breast: Secondary | ICD-10-CM

## 2019-11-11 LAB — CYTOLOGY - PAP
Comment: NEGATIVE
Diagnosis: NEGATIVE
High risk HPV: NEGATIVE

## 2019-11-24 ENCOUNTER — Other Ambulatory Visit: Payer: 59

## 2020-09-12 ENCOUNTER — Other Ambulatory Visit: Payer: Self-pay | Admitting: Obstetrics & Gynecology

## 2020-09-12 DIAGNOSIS — Z30011 Encounter for initial prescription of contraceptive pills: Secondary | ICD-10-CM

## 2020-09-14 ENCOUNTER — Other Ambulatory Visit: Payer: Self-pay | Admitting: Obstetrics & Gynecology

## 2020-09-14 DIAGNOSIS — Z1231 Encounter for screening mammogram for malignant neoplasm of breast: Secondary | ICD-10-CM

## 2020-11-12 ENCOUNTER — Ambulatory Visit
Admission: RE | Admit: 2020-11-12 | Discharge: 2020-11-12 | Disposition: A | Discharge status: Home / Self Care | Payer: 59 | Source: Ambulatory Visit | Attending: Obstetrics & Gynecology | Admitting: Obstetrics & Gynecology

## 2020-11-12 ENCOUNTER — Ambulatory Visit: Payer: 59

## 2020-11-12 ENCOUNTER — Other Ambulatory Visit: Payer: Self-pay

## 2020-11-12 ENCOUNTER — Encounter: Payer: Self-pay | Admitting: Obstetrics & Gynecology

## 2020-11-12 ENCOUNTER — Ambulatory Visit (INDEPENDENT_AMBULATORY_CARE_PROVIDER_SITE_OTHER): Payer: 59 | Admitting: Obstetrics & Gynecology

## 2020-11-12 VITALS — BP 159/89 | HR 85 | Ht 65.0 in | Wt 219.6 lb

## 2020-11-12 DIAGNOSIS — Z3041 Encounter for surveillance of contraceptive pills: Secondary | ICD-10-CM

## 2020-11-12 DIAGNOSIS — Z01419 Encounter for gynecological examination (general) (routine) without abnormal findings: Secondary | ICD-10-CM

## 2020-11-12 DIAGNOSIS — R030 Elevated blood-pressure reading, without diagnosis of hypertension: Secondary | ICD-10-CM

## 2020-11-12 DIAGNOSIS — Z1231 Encounter for screening mammogram for malignant neoplasm of breast: Secondary | ICD-10-CM

## 2020-11-12 MED ORDER — NORGESTIM-ETH ESTRAD TRIPHASIC 0.18/0.215/0.25 MG-25 MCG PO TABS: 1.0000 | ORAL_TABLET | Freq: Every day | ORAL | 10 refills | Status: DC

## 2020-11-12 NOTE — Progress Notes (Signed)
GYNECOLOGY ANNUAL PREVENTATIVE CARE ENCOUNTER NOTE  History:     Cheryl Martinez is a 44 y.o. [phone] female here for a routine annual gynecologic exam.  Current complaints: none.   Denies abnormal vaginal bleeding, discharge, pelvic pain, problems with intercourse or other gynecologic concerns.    Gynecologic History Patient's last menstrual period was 11/09/2020 (approximate). Contraception: OCP (estrogen/progesterone) Last Pap: 11/07/2019. Results were: normal with negative HPV Last mammogram: 11/12/2020. Results were: pending  Obstetric History OB History  Gravida Para Term Preterm AB Living  7 3 2 1 3 2   SAB TAB Ectopic Multiple Live Births  3       2    # Outcome Date GA Lbr Len/2nd Weight Sex Delivery Anes PTL Lv  7 Term 03/26/11 [redacted]w[redacted]d  8 lb 6 oz (3.799 kg) M Vag-Spont  N LIV     Birth Comments: Shoulder dystocia  6 Term 12/15/99 [redacted]w[redacted]d  7 lb 2 oz (3.232 kg) F Vag-Spont   LIV  5 Gravida              Birth Comments: System Generated. Please review and update pregnancy details.  4 SAB  [redacted]w[redacted]d         3 SAB  [redacted]w[redacted]d         2 SAB  [redacted]w[redacted]d         1 Preterm  [redacted]w[redacted]d            Birth Comments: abruption    Past Medical History:  Diagnosis Date  . 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 Degenerative arthritis of left knee 02/2014  . Hypothyroidism   . Knee dislocation 02/2014   left  . 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 Vaginal Pap smear, abnormal     Past Surgical History:  Procedure Laterality Date  . GALLBLADDER SURGERY  1997  . KNEE ARTHROSCOPY WITH PATELLA RECONSTRUCTION Left 02/17/2014   Procedure: LEFT KNEE ARTHROSCOPY WITH LATERAL RELEASE, DEBRIDEMENT/SHAVING (CHONDROPLASTY), SYNOVECTOMY LIMITED WITH RECONSTRUCTION DISLOCATING  PATELLA WITH EXTENSOR REALIGNMENT MUSCLE ADVANCEMENT;  Surgeon: 02/19/2014., MD;  [facility];  Service: Orthopedics;  Laterality: Left;  . KNEE SURGERY Right 1994    Current Outpatient Medications on  File Prior to Visit  Medication Sig Dispense Refill  . levothyroxine (SYNTHROID, LEVOTHROID) 75 MCG tablet Take by mouth.    . [DISCONTINUED] venlafaxine (EFFEXOR XR) 37.5 MG 24 hr capsule Take 37.5 mg by mouth daily.     No current facility-administered medications on file prior to visit.    Allergies  Allergen Reactions  . Penicillins Itching    Social History:  reports that she has never smoked. She has never used smokeless tobacco. She reports current alcohol use. She reports that she does not use drugs.  Family History  Problem Relation Age of Onset  . Diabetes Maternal Grandmother   . Cancer Maternal Grandmother        BREAST  . Breast cancer Maternal Grandmother     The following portions of the patient's history were reviewed and updated as appropriate: allergies, current medications, past family history, past medical history, past social history, past surgical history and problem list.  Review of Systems Pertinent items noted in HPI and remainder of comprehensive ROS otherwise negative.  Physical Exam:  BP (!) 159/89   Pulse 85   Ht 5\' 5"  (1.651 m)   Wt 219 lb 9.6 oz (99.6 kg)   LMP 11/09/2020 (Approximate)   BMI 36.54  kg/m    BP Readings from Last 3 Encounters:  11/12/20 (!) 159/89  11/07/19 (!) 166/96  11/02/18 133/81   CONSTITUTIONAL: Well-developed, well-nourished female in no acute distress.  HENT:  Normocephalic, atraumatic, External right and left ear normal.  EYES: Conjunctivae and EOM are normal. Pupils are equal, round, and reactive to light. No scleral icterus.  NECK: Normal range of motion, supple, no masses.  Normal thyroid.  SKIN: Skin is warm and dry. No rash noted. Not diaphoretic. No erythema. No pallor. MUSCULOSKELETAL: Normal range of motion. No tenderness.  No cyanosis, clubbing, or edema.   NEUROLOGIC: Alert and oriented to person, place, and time. Normal reflexes, muscle tone coordination.  PSYCHIATRIC: Normal mood and affect. Normal  behavior. Normal judgment and thought content. CARDIOVASCULAR: Normal heart rate noted, regular rhythm RESPIRATORY: Clear to auscultation bilaterally. Effort and breath sounds normal, no problems with respiration noted. BREASTS: Symmetric in size. No masses, tenderness, skin changes, nipple drainage, or lymphadenopathy bilaterally. Performed in the presence of a chaperone. ABDOMEN: Soft, no distention noted.  No tenderness, rebound or guarding.  PELVIC: Deferred   Assessment and Plan:      1. Uses oral contraceptives OCPs refilled.  Concerned about elevated BP here, she reports having normal BP with PCP and other offices. She will recheck it later this week and let us know via MyChart. If BP still elevated, concerned about estrogen use and increased risk of CVA/VTE, patient informed of this concerns. - Norgestimate-Ethinyl Estradiol Triphasic (TRI-LO-MARZIA) 0.18/0.215/0.25 MG-25 MCG tab; Take 1 tablet by mouth daily.  Dispense: 84 tablet; Refill: 10  2. Well woman exam Up-to-date on pap smear and mammogram, next pap due in 2023. Routine preventative health maintenance measures emphasized. Please refer to After Visit Summary for other counseling recommendations.      [name], MD, FACOG Obstetrician & Gynecologist, [facility] for [employer], [facility]

## 2020-11-12 NOTE — Patient Instructions (Addendum)
Preventive Care 42-42 Years Old, Female Preventive care refers to visits with your health care provider and lifestyle choices that can promote health and wellness. This includes:  A yearly physical exam. This may also be called an annual well check.  Regular dental visits and eye exams.  Immunizations.  Screening for certain conditions.  Healthy lifestyle choices, such as eating a healthy diet, getting regular exercise, not using drugs or products that contain nicotine and tobacco, and limiting alcohol use. What can I expect for my preventive care visit? Physical exam Your health care provider will check your:  Height and weight. This may be used to calculate body mass index (BMI), which tells if you are at a healthy weight.  Heart rate and blood pressure.  Skin for abnormal spots. Counseling Your health care provider may ask you questions about your:  Alcohol, tobacco, and drug use.  Emotional well-being.  Home and relationship well-being.  Sexual activity.  Eating habits.  Work and work environment.  Method of birth control.  Menstrual cycle.  Pregnancy history. What immunizations do I need?  Influenza (flu) vaccine  This is recommended every year. Tetanus, diphtheria, and pertussis (Tdap) vaccine  You may need a Td booster every 10 years. Varicella (chickenpox) vaccine  You may need this if you have not been vaccinated. Zoster (shingles) vaccine  You may need this after age 60. Measles, mumps, and rubella (MMR) vaccine  You may need at least one dose of MMR if you were born in 1957 or later. You may also need a second dose. Pneumococcal conjugate (PCV13) vaccine  You may need this if you have certain conditions and were not previously vaccinated. Pneumococcal polysaccharide (PPSV23) vaccine  You may need one or two doses if you smoke cigarettes or if you have certain conditions. Meningococcal conjugate (MenACWY) vaccine  You may need this if you  have certain conditions. Hepatitis A vaccine  You may need this if you have certain conditions or if you travel or work in places where you may be exposed to hepatitis A. Hepatitis B vaccine  You may need this if you have certain conditions or if you travel or work in places where you may be exposed to hepatitis B. Haemophilus influenzae type b (Hib) vaccine  You may need this if you have certain conditions. Human papillomavirus (HPV) vaccine  If recommended by your health care provider, you may need three doses over 6 months. You may receive vaccines as individual doses or as more than one vaccine together in one shot (combination vaccines). Talk with your health care provider about the risks and benefits of combination vaccines. What tests do I need? Blood tests  Lipid and cholesterol levels. These may be checked every 5 years, or more frequently if you are over 42 years old.  Hepatitis C test.  Hepatitis B test. Screening  Lung cancer screening. You may have this screening every year starting at age 422 if you have a 30-pack-year history of smoking and currently smoke or have quit within the past 15 years.  Colorectal cancer screening. All adults should have this screening starting at age 422 and continuing until age 75. Your health care provider may recommend screening at age 42 if you are at increased risk. You will have tests every 1-10 years, depending on your results and the type of screening test.  Diabetes screening. This is done by checking your blood sugar (glucose) after you have not eaten for a while (fasting). You may have this   done every 1-3 years.  Mammogram. This may be done every 1-2 years. Talk with your health care provider about when you should start having regular mammograms. This may depend on whether you have a family history of breast cancer.  BRCA-related cancer screening. This may be done if you have a family history of breast, ovarian, tubal, or peritoneal  cancers.  Pelvic exam and Pap test. This may be done every 3 years starting at age 422. Starting at age 37, this may be done every 5 years if you have a Pap test in combination with an HPV test. Other tests  Sexually transmitted disease (STD) testing.  Bone density scan. This is done to screen for osteoporosis. You may have this scan if you are at high risk for osteoporosis. Follow these instructions at home: Eating and drinking  Eat a diet that includes fresh fruits and vegetables, whole grains, lean protein, and low-fat dairy.  Take vitamin and mineral supplements as recommended by your health care provider.  Do not drink alcohol if: ? Your health care provider tells you not to drink. ? You are pregnant, may be pregnant, or are planning to become pregnant.  If you drink alcohol: ? Limit how much you have to 0-1 drink a day. ? Be aware of how much alcohol is in your drink. In the [place] one drink equals one 12 oz bottle of beer (355 mL), one 5 oz glass of wine (148 mL), or one 1 oz glass of hard liquor (44 mL). Lifestyle  Take daily care of your teeth and gums.  Stay active. Exercise for at least 30 minutes on 5 or more days each week.  Do not use any products that contain nicotine or tobacco, such as cigarettes, e-cigarettes, and chewing tobacco. If you need help quitting, ask your health care provider.  If you are sexually active, practice safe sex. Use a condom or other form of birth control (contraception) in order to prevent pregnancy and STIs (sexually transmitted infections).  If told by your health care provider, take low-dose aspirin daily starting at age 42. What's next?  Visit your health care provider once a year for a well check visit.  Ask your health care provider how often you should have your eyes and teeth checked.  Stay up to date on all vaccines. This information is not intended to replace advice given to you by your health care provider. Make sure you  discuss any questions you have with your health care provider. Document Revised: 08/05/2018 Document Reviewed: 08/05/2018 Elsevier Patient Education  [place]. Please check BP this week and send via MyChart   Preventive Care 71-32 Years Old, Female Preventive care refers to visits with your health care provider and lifestyle choices that can promote health and wellness. This includes:  A yearly physical exam. This may also be called an annual well check.  Regular dental visits and eye exams.  Immunizations.  Screening for certain conditions.  Healthy lifestyle choices, such as eating a healthy diet, getting regular exercise, not using drugs or products that contain nicotine and tobacco, and limiting alcohol use. What can I expect for my preventive care visit? Physical exam Your health care provider will check your:  Height and weight. This may be used to calculate body mass index (BMI), which tells if you are at a healthy weight.  Heart rate and blood pressure.  Skin for abnormal spots. Counseling Your health care provider may ask you questions about your:  Alcohol,  tobacco, and drug use.  Emotional well-being.  Home and relationship well-being.  Sexual activity.  Eating habits.  Work and work Astronomer.  Method of birth control.  Menstrual cycle.  Pregnancy history. What immunizations do I need?  Influenza (flu) vaccine  This is recommended every year. Tetanus, diphtheria, and pertussis (Tdap) vaccine  You may need a Td booster every 10 years. Varicella (chickenpox) vaccine  You may need this if you have not been vaccinated. Zoster (shingles) vaccine  You may need this after age 62. Measles, mumps, and rubella (MMR) vaccine  You may need at least one dose of MMR if you were born in 1957 or later. You may also need a second dose. Pneumococcal conjugate (PCV13) vaccine  You may need this if you have certain conditions and were not  previously vaccinated. Pneumococcal polysaccharide (PPSV23) vaccine  You may need one or two doses if you smoke cigarettes or if you have certain conditions. Meningococcal conjugate (MenACWY) vaccine  You may need this if you have certain conditions. Hepatitis A vaccine  You may need this if you have certain conditions or if you travel or work in places where you may be exposed to hepatitis A. Hepatitis B vaccine  You may need this if you have certain conditions or if you travel or work in places where you may be exposed to hepatitis B. Haemophilus influenzae type b (Hib) vaccine  You may need this if you have certain conditions. Human papillomavirus (HPV) vaccine  If recommended by your health care provider, you may need three doses over 6 months. You may receive vaccines as individual doses or as more than one vaccine together in one shot (combination vaccines). Talk with your health care provider about the risks and benefits of combination vaccines. What tests do I need? Blood tests  Lipid and cholesterol levels. These may be checked every 5 years, or more frequently if you are over 34 years old.  Hepatitis C test.  Hepatitis B test. Screening  Lung cancer screening. You may have this screening every year starting at age 78 if you have a 30-pack-year history of smoking and currently smoke or have quit within the past 15 years.  Colorectal cancer screening. All adults should have this screening starting at age 71 and continuing until age 1. Your health care provider may recommend screening at age 90 if you are at increased risk. You will have tests every 1-10 years, depending on your results and the type of screening test.  Diabetes screening. This is done by checking your blood sugar (glucose) after you have not eaten for a while (fasting). You may have this done every 1-3 years.  Mammogram. This may be done every 1-2 years. Talk with your health care provider about when you  should start having regular mammograms. This may depend on whether you have a family history of breast cancer.  BRCA-related cancer screening. This may be done if you have a family history of breast, ovarian, tubal, or peritoneal cancers.  Pelvic exam and Pap test. This may be done every 3 years starting at age 63. Starting at age 17, this may be done every 5 years if you have a Pap test in combination with an HPV test. Other tests  Sexually transmitted disease (STD) testing.  Bone density scan. This is done to screen for osteoporosis. You may have this scan if you are at high risk for osteoporosis. Follow these instructions at home: Eating and drinking  Eat a diet that  includes fresh fruits and vegetables, whole grains, lean protein, and low-fat dairy.  Take vitamin and mineral supplements as recommended by your health care provider.  Do not drink alcohol if: ? Your health care provider tells you not to drink. ? You are pregnant, may be pregnant, or are planning to become pregnant.  If you drink alcohol: ? Limit how much you have to 0-1 drink a day. ? Be aware of how much alcohol is in your drink. In the [place] one drink equals one 12 oz bottle of beer (355 mL), one 5 oz glass of wine (148 mL), or one 1 oz glass of hard liquor (44 mL). Lifestyle  Take daily care of your teeth and gums.  Stay active. Exercise for at least 30 minutes on 5 or more days each week.  Do not use any products that contain nicotine or tobacco, such as cigarettes, e-cigarettes, and chewing tobacco. If you need help quitting, ask your health care provider.  If you are sexually active, practice safe sex. Use a condom or other form of birth control (contraception) in order to prevent pregnancy and STIs (sexually transmitted infections).  If told by your health care provider, take low-dose aspirin daily starting at age 423. What's next?  Visit your health care provider once a year for a well check visit.   Ask your health care provider how often you should have your eyes and teeth checked.  Stay up to date on all vaccines. This information is not intended to replace advice given to you by your health care provider. Make sure you discuss any questions you have with your health care provider. Document Revised: 08/05/2018 Document Reviewed: 08/05/2018 Elsevier Patient Education  2020 [employer].

## 2021-01-14 IMAGING — MG DIGITAL SCREENING BILAT W/ TOMO W/ CAD
8 series · 9 of 24 positions shown · non-contrast
Comparison: Previous exam(s).

CLINICAL DATA: Screening.

EXAM:
DIGITAL SCREENING BILATERAL MAMMOGRAM WITH TOMO AND CAD

[L MLO synth-2D]
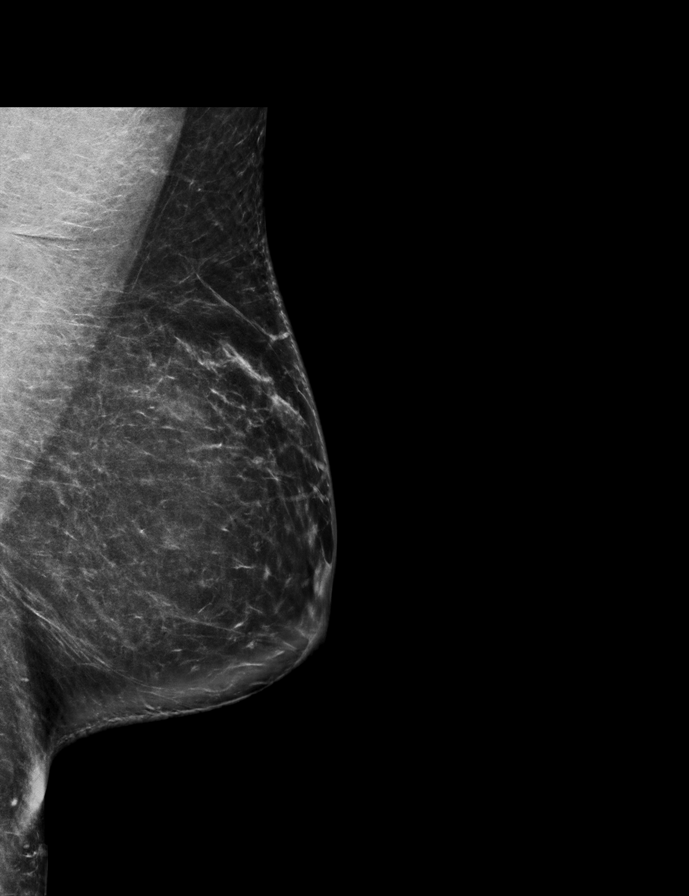

[R CC synth-2D]
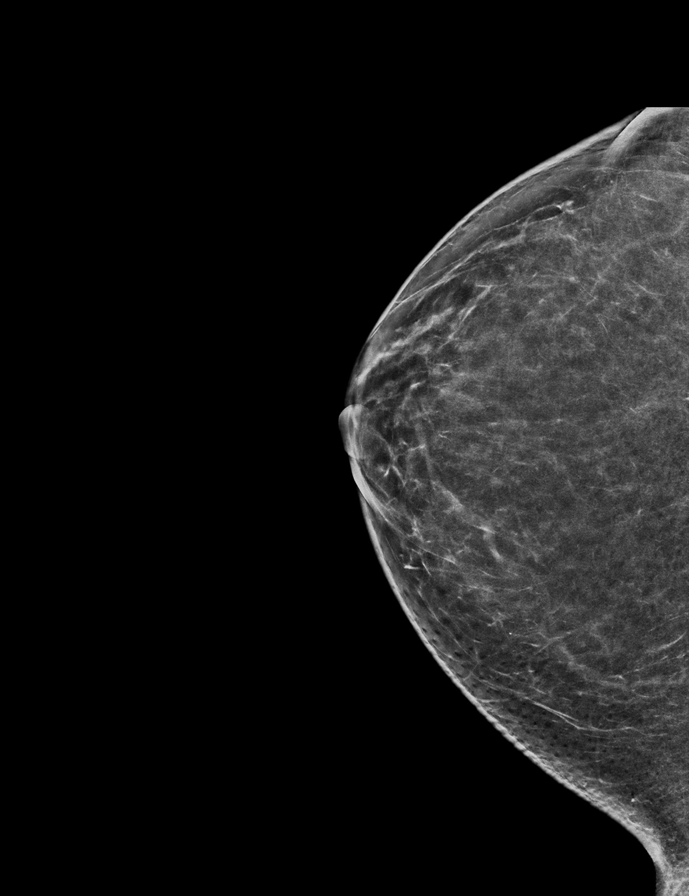

[R MLO synth-2D]
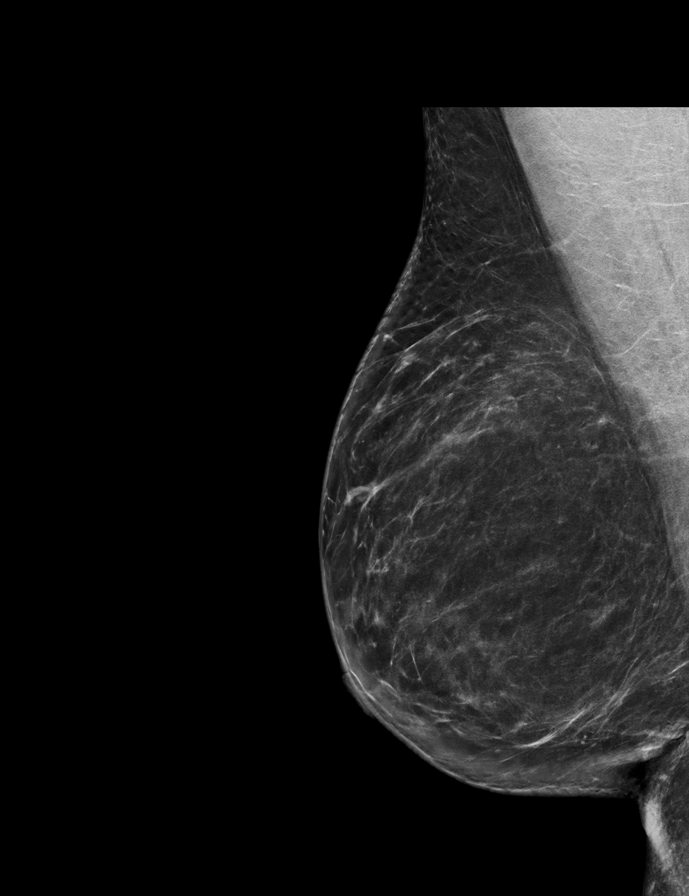

[L CC synth-2D]
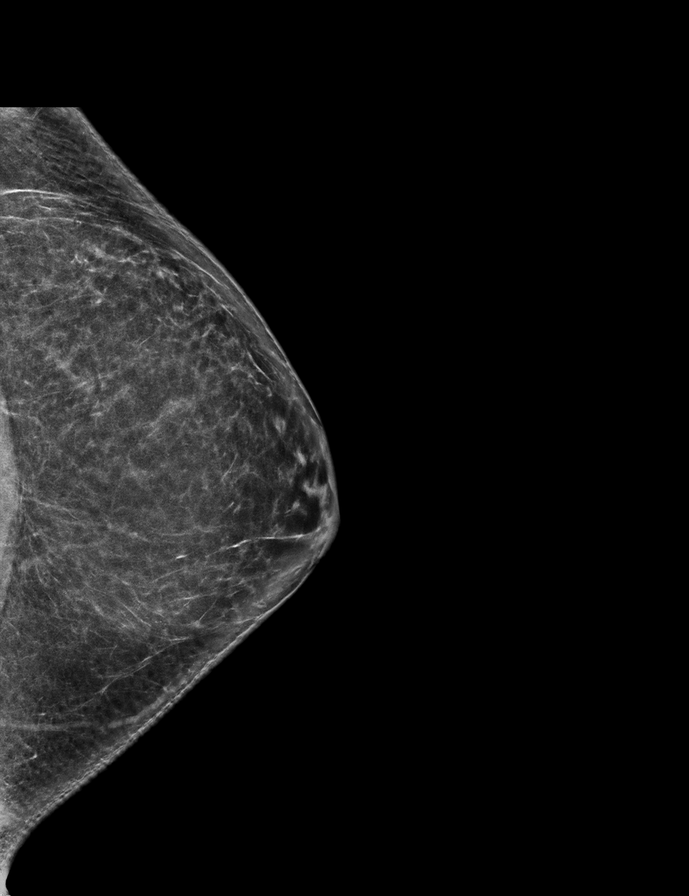

[R MLO tomo · 2 of 78 frames shown]
[frame 26/78]
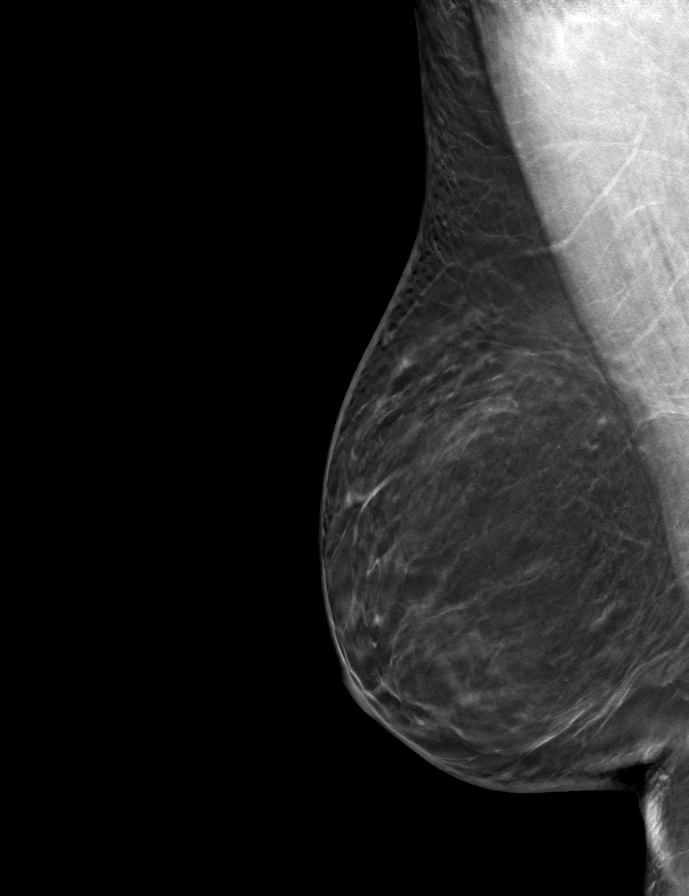
[frame 39/78]
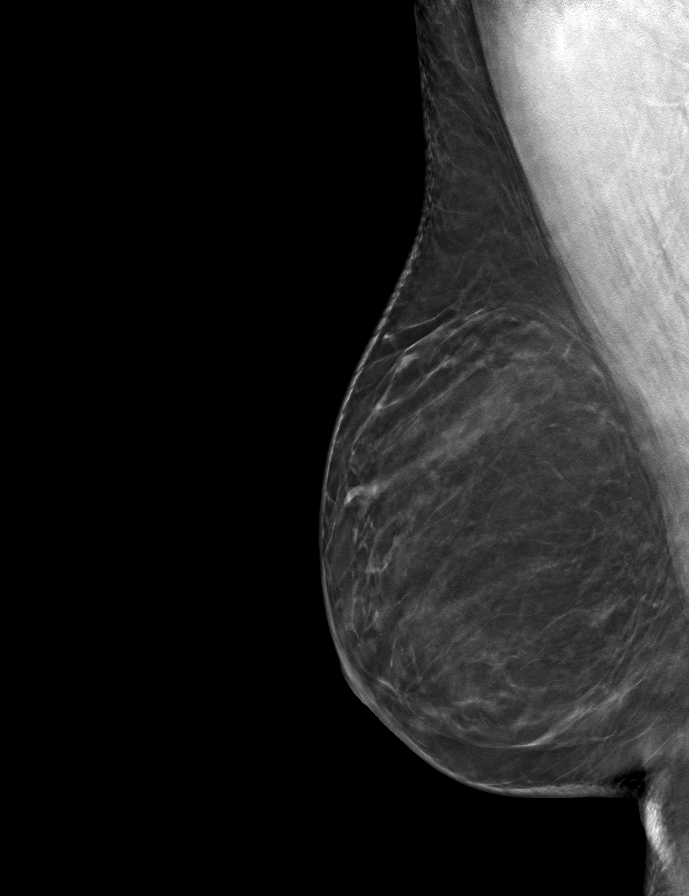

[L CC tomo · tomo slice 35/69.0]
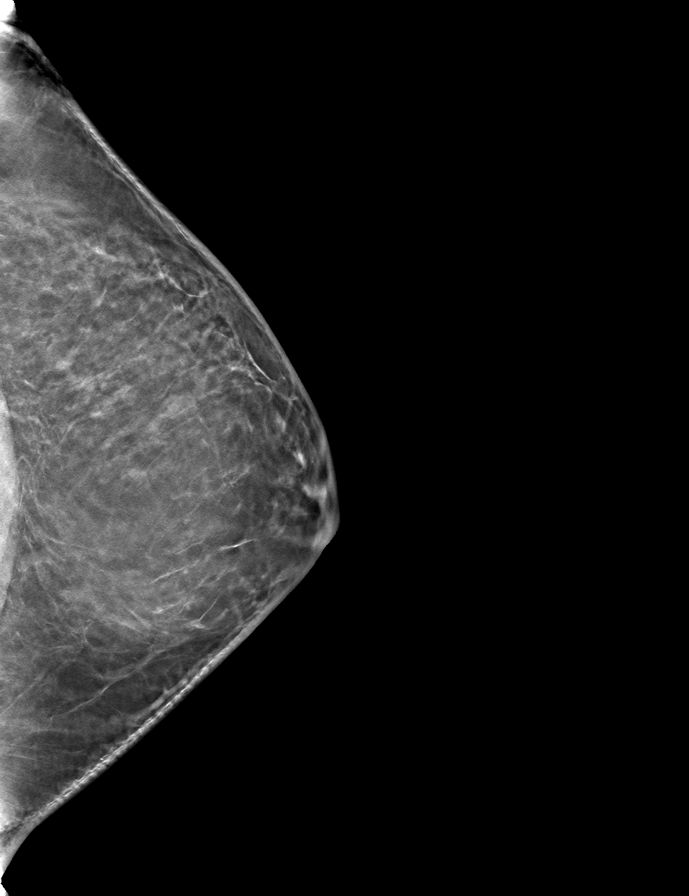

[L MLO tomo · tomo slice 41/81.0]
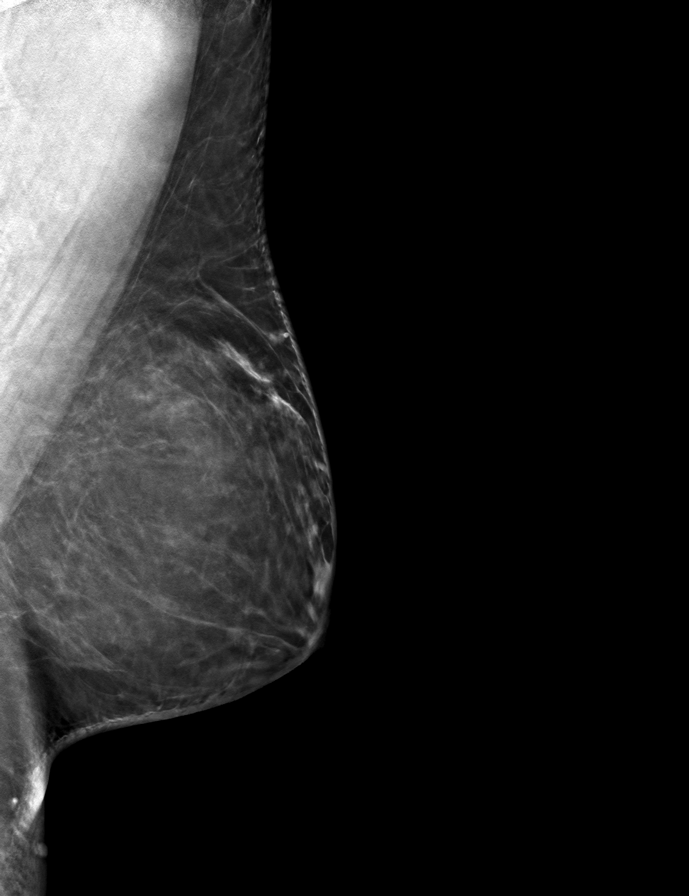

[R CC tomo · tomo slice 35/70.0]
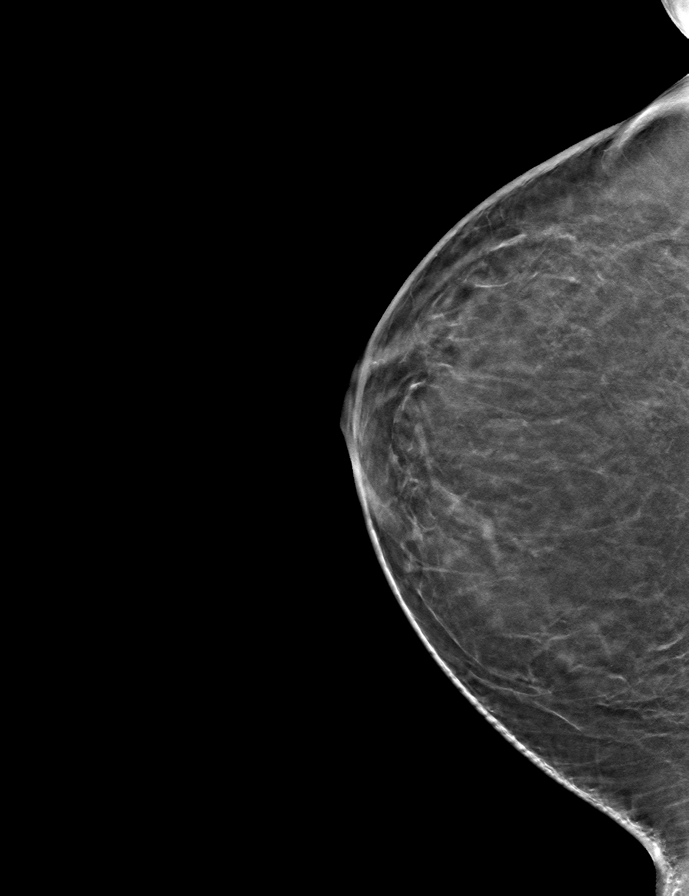

[9 of 24 positions shown; findings below may reference images not displayed]

ACR Breast Density Category b: There are scattered areas of
fibroglandular density.
FINDINGS: There are no findings suspicious for malignancy. Images were
processed with CAD.
IMPRESSION: No mammographic evidence of malignancy. A result letter of this
screening mammogram will be mailed directly to the patient.

RECOMMENDATION:
Screening mammogram in one year. (Code:[id number])

BI-RADS CATEGORY  1: Negative.

## 2021-08-05 ENCOUNTER — Other Ambulatory Visit: Payer: Self-pay

## 2021-08-05 ENCOUNTER — Encounter: Payer: Self-pay | Admitting: Obstetrics & Gynecology

## 2021-08-05 ENCOUNTER — Ambulatory Visit (INDEPENDENT_AMBULATORY_CARE_PROVIDER_SITE_OTHER): Payer: 59 | Admitting: Obstetrics & Gynecology

## 2021-08-05 VITALS — BP 167/94 | HR 87 | Wt 228.0 lb

## 2021-08-05 DIAGNOSIS — R030 Elevated blood-pressure reading, without diagnosis of hypertension: Secondary | ICD-10-CM

## 2021-08-05 NOTE — Progress Notes (Signed)
   GYNECOLOGY OFFICE VISIT NOTE  History:   Cheryl Martinez is a 45 y.o. [phone] here today for discussion of elevated BP. Has noticed this at home, BP 140s/80-90s and at her endocrinologist's office.  They told her to come in to discuss switching her OCP. She denies any abnormal vaginal discharge, bleeding, pelvic pain or other concerns.    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   Vaginal Pap smear, abnormal     Past Surgical History:  Procedure Laterality Date   GALLBLADDER SURGERY  1997   KNEE ARTHROSCOPY WITH PATELLA RECONSTRUCTION Left 02/17/2014   Procedure: LEFT KNEE ARTHROSCOPY WITH LATERAL RELEASE, DEBRIDEMENT/SHAVING (CHONDROPLASTY), SYNOVECTOMY LIMITED WITH RECONSTRUCTION DISLOCATING  PATELLA WITH EXTENSOR REALIGNMENT MUSCLE ADVANCEMENT;  Surgeon: Thera Flake., MD;  Location: [facility];  Service: Orthopedics;  Laterality: Left;   KNEE SURGERY Right 1994    The following portions of the patient's history were reviewed and updated as appropriate: allergies, current medications, past family history, past medical history, past social history, past surgical history and problem list.   Health Maintenance:  Normal pap and negative HRHPV on 11/06/2021.  Normal mammogram on 11/12/2020.   Review of Systems:  Pertinent items noted in HPI and remainder of comprehensive ROS otherwise negative.  Physical Exam:  BP (!) 167/94   Pulse 87   Wt 228 lb (103.4 kg)   BMI 37.94 kg/m  CONSTITUTIONAL: Well-developed, well-nourished female in no acute distress.  HEENT:  Normocephalic, atraumatic. External right and left ear normal. No scleral icterus.  NECK: Normal range of motion, supple, no masses noted on observation SKIN: No rash noted. Not diaphoretic. No erythema. No  pallor. MUSCULOSKELETAL: Normal range of motion. No edema noted. NEUROLOGIC: Alert and oriented to person, place, and time. Normal muscle tone coordination. No cranial nerve deficit noted. PSYCHIATRIC: Normal mood and affect. Normal behavior. Normal judgment and thought content. CARDIOVASCULAR: Normal heart rate noted RESPIRATORY: Effort and breath sounds normal, no problems with respiration noted ABDOMEN: No masses noted. No other overt distention noted.   PELVIC: Deferred    Assessment and Plan:     Elevated blood pressure reading without diagnosis of hypertension Will discontinue estrogen-progestin pill for now. Patient given samples of Slynd (progestin-only) pills Will reevaluate in one month. If still has elevated BP, she will be referred back to PCP for management.   Routine preventative health maintenance measures emphasized. Please refer to After Visit Summary for other counseling recommendations.   Return in about 1 month (around 09/05/2021) for OCP/BP Check.    I spent 15 minutes dedicated to the care of this patient including pre-visit review of records, face to face time with the patient discussing her conditions and treatments and post visit orders.    [name], MD, FACOG Obstetrician & Gynecologist, [facility] for [employer], [facility]

## 2021-09-05 ENCOUNTER — Ambulatory Visit (INDEPENDENT_AMBULATORY_CARE_PROVIDER_SITE_OTHER): Payer: 59 | Admitting: Obstetrics & Gynecology

## 2021-09-05 ENCOUNTER — Encounter: Payer: Self-pay | Admitting: Obstetrics & Gynecology

## 2021-09-05 ENCOUNTER — Other Ambulatory Visit: Payer: Self-pay

## 2021-09-05 VITALS — BP 142/92 | HR 116

## 2021-09-05 DIAGNOSIS — I10 Essential (primary) hypertension: Secondary | ICD-10-CM | POA: Diagnosis not present

## 2021-09-05 DIAGNOSIS — Z3041 Encounter for surveillance of contraceptive pills: Secondary | ICD-10-CM | POA: Diagnosis not present

## 2021-09-05 MED ORDER — SLYND 4 MG PO TABS: 1.0000 | ORAL_TABLET | Freq: Every day | ORAL | 12 refills | Status: DC

## 2021-09-05 NOTE — Progress Notes (Signed)
   GYNECOLOGY OFFICE VISIT NOTE  History:   Cheryl Martinez is a 45 y.o. [phone] here today for BP and OCP check.  During last visit, she desired OCP refill but was noted to have elevated BP. Slynd was prescribed.  Reports elevated BP at home 130-150/80-90s.  Also has a lot of family stressors lately. Satisfied with Slynd though.  She denies any abnormal vaginal discharge, bleeding, pelvic pain or other concerns.    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   Vaginal Pap smear, abnormal     Past Surgical History:  Procedure Laterality Date   GALLBLADDER SURGERY  1997   KNEE ARTHROSCOPY WITH PATELLA RECONSTRUCTION Left 02/17/2014   Procedure: LEFT KNEE ARTHROSCOPY WITH LATERAL RELEASE, DEBRIDEMENT/SHAVING (CHONDROPLASTY), SYNOVECTOMY LIMITED WITH RECONSTRUCTION DISLOCATING  PATELLA WITH EXTENSOR REALIGNMENT MUSCLE ADVANCEMENT;  Surgeon: Thera Flake., MD;  Location: [facility];  Service: Orthopedics;  Laterality: Left;   KNEE SURGERY Right 1994    The following portions of the patient's history were reviewed and updated as appropriate: allergies, current medications, past family history, past medical history, past social history, past surgical history and problem list.   Health Maintenance:  Normal pap and negative HRHPV on 11/07/2019.  Normal mammogram on 11/12/2020.   Review of Systems:  Pertinent items noted in HPI and remainder of comprehensive ROS otherwise negative.  Physical Exam:  BP (!) 142/92   Pulse (!) 116  CONSTITUTIONAL: Well-developed, well-nourished female in no acute distress.  HEENT:  Normocephalic, atraumatic. External right and left ear normal. No scleral icterus.  NECK: Normal range of motion, supple, no masses noted on observation SKIN: No rash noted. Not diaphoretic. No  erythema. No pallor. MUSCULOSKELETAL: Normal range of motion. No edema noted. NEUROLOGIC: Alert and oriented to person, place, and time. Normal muscle tone coordination. No cranial nerve deficit noted. PSYCHIATRIC: Normal mood and affect. Normal behavior. Normal judgment and thought content. CARDIOVASCULAR: Normal heart rate noted RESPIRATORY: Effort and breath sounds normal, no problems with respiration noted ABDOMEN: No masses noted. No other overt distention noted.   PELVIC: Deferred     Assessment and Plan:     1. Primary hypertension 2. Oral contraceptive pill surveillance Will continue Slynd for now, this was prescribed. Offered prescription of antihypertensive now, she wants to defer this to her PCP who she will stay within the week.   - Drospirenone (SLYND) 4 MG TABS; Take 1 tablet by mouth daily.  Dispense: 28 tablet; Refill: 12 Routine preventative health maintenance measures emphasized. Please refer to After Visit Summary for other counseling recommendations.   Return for any gynecologic concerns.    I spent 15 minutes dedicated to the care of this patient including pre-visit review of records, face to face time with the patient discussing her conditions and treatments and post visit orders.    [name], MD, FACOG Obstetrician & Gynecologist, [facility] for [employer], [facility]

## 2021-10-17 ENCOUNTER — Other Ambulatory Visit: Payer: Self-pay | Admitting: Obstetrics & Gynecology

## 2021-10-17 ENCOUNTER — Encounter: Payer: Self-pay | Admitting: Radiology

## 2021-10-17 DIAGNOSIS — Z1231 Encounter for screening mammogram for malignant neoplasm of breast: Secondary | ICD-10-CM

## 2021-11-20 ENCOUNTER — Ambulatory Visit
Admission: RE | Admit: 2021-11-20 | Discharge: 2021-11-20 | Disposition: A | Discharge status: Home / Self Care | Payer: 59 | Source: Ambulatory Visit | Attending: Obstetrics & Gynecology | Admitting: Obstetrics & Gynecology

## 2021-11-20 DIAGNOSIS — Z1231 Encounter for screening mammogram for malignant neoplasm of breast: Secondary | ICD-10-CM

## 2022-02-14 ENCOUNTER — Telehealth: Payer: Self-pay

## 2022-02-14 NOTE — Telephone Encounter (Signed)
Patient reports insurance is not covering her BCP "Slynd." ?Spoke with My Neurosurgeon. There is a $500 coupon available for Slynd which brings pt's out of pocket expense to $56.90 for 90 day supply or $25 for 30 day supply.  ?Pt informed and agrees to pay $56.90 for 90 day supply.  ?

## 2022-03-06 ENCOUNTER — Ambulatory Visit (INDEPENDENT_AMBULATORY_CARE_PROVIDER_SITE_OTHER): Payer: 59 | Admitting: Obstetrics & Gynecology

## 2022-03-06 ENCOUNTER — Other Ambulatory Visit (HOSPITAL_COMMUNITY)
Admission: RE | Admit: 2022-03-06 | Discharge: 2022-03-06 | Disposition: A | Discharge status: Home / Self Care | Payer: 59 | Source: Ambulatory Visit | Attending: Obstetrics & Gynecology | Admitting: Obstetrics & Gynecology

## 2022-03-06 ENCOUNTER — Encounter: Payer: Self-pay | Admitting: Obstetrics & Gynecology

## 2022-03-06 VITALS — BP 148/83 | Ht 64.0 in | Wt 230.8 lb

## 2022-03-06 DIAGNOSIS — Z113 Encounter for screening for infections with a predominantly sexual mode of transmission: Secondary | ICD-10-CM | POA: Insufficient documentation

## 2022-03-06 DIAGNOSIS — Z01419 Encounter for gynecological examination (general) (routine) without abnormal findings: Secondary | ICD-10-CM | POA: Diagnosis present

## 2022-03-06 DIAGNOSIS — Z3041 Encounter for surveillance of contraceptive pills: Secondary | ICD-10-CM

## 2022-03-06 NOTE — Progress Notes (Signed)
? ? ?GYNECOLOGY ANNUAL PREVENTATIVE CARE ENCOUNTER NOTE ? ?History:    ? Cheryl Martinez is a 46 y.o. [phone] female here for a routine annual gynecologic exam.  Current complaints: none. Satisfied with Slynd for contraception and period control.  Desires annual STI screen.  Denies abnormal vaginal bleeding, discharge, pelvic pain, problems with intercourse or other gynecologic concerns.  ?  ?Gynecologic History ?No LMP recorded. (Menstrual status: Oral contraceptives). ?Contraception: oral progesterone-only contraceptive ?Last Pap: 11/07/2019. Result was normal with negative HPV ?Last Mammogram: 11/20/2021.  Result was normal ? ?Obstetric History ?OB History  ?Gravida Para Term Preterm AB Living  ?7 3 2 1 3 2   ?SAB IAB Ectopic Multiple Live Births  ?3       2  ?  ?# Outcome Date GA Lbr Len/2nd Weight Sex Delivery Anes PTL Lv  ?7 Term 03/26/11 [redacted]w[redacted]d  8 lb 6 oz (3.799 kg) M Vag-Spont  N LIV  ?   Birth Comments: Shoulder dystocia  ?6 Term 12/15/99 [redacted]w[redacted]d  7 lb 2 oz (3.232 kg) F Vag-Spont   LIV  ?5 Gravida           ?   Birth Comments: System Generated. Please review and update pregnancy details.  ?4 SAB  [redacted]w[redacted]d         ?3 SAB  [redacted]w[redacted]d         ?2 SAB  [redacted]w[redacted]d         ?1 Preterm  [redacted]w[redacted]d         ?   Birth Comments: abruption  ? ? ?Past Medical History:  ?Diagnosis Date  ? 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 Degenerative arthritis of left knee 02/2014  ? Hypothyroidism   ? Knee dislocation 02/2014  ? left  ? 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 Vaginal Pap smear, abnormal   ? ? ?Past Surgical History:  ?Procedure Laterality Date  ? GALLBLADDER SURGERY  1997  ? KNEE ARTHROSCOPY WITH PATELLA RECONSTRUCTION Left 02/17/2014  ? Procedure: LEFT KNEE ARTHROSCOPY WITH LATERAL RELEASE, DEBRIDEMENT/SHAVING (CHONDROPLASTY), SYNOVECTOMY LIMITED WITH RECONSTRUCTION DISLOCATING  PATELLA WITH EXTENSOR REALIGNMENT MUSCLE ADVANCEMENT;  Surgeon: Yvette Rack., MD;  Location: [facility];  Service: Orthopedics;  Laterality:  Left;  ? KNEE SURGERY Right 1994  ? ? ?Current Outpatient Medications on File Prior to Visit  ?Medication Sig Dispense Refill  ? Drospirenone (SLYND) 4 MG TABS Take 1 tablet by mouth daily. 28 tablet 12  ? levothyroxine (SYNTHROID, LEVOTHROID) 75 MCG tablet Take by mouth.    ? [DISCONTINUED] venlafaxine (EFFEXOR XR) 37.5 MG 24 hr capsule Take 37.5 mg by mouth daily.    ? ?No current facility-administered medications on file prior to visit.  ? ? ?Allergies  ?Allergen Reactions  ? Penicillins Itching  ? ? ?Social History:  reports that she has never smoked. She has never used smokeless tobacco. She reports current alcohol use. She reports that she does not use drugs. ? ?Family History  ?Problem Relation Age of Onset  ? Diabetes Maternal Grandmother   ? Cancer Maternal Grandmother   ?     BREAST  ? Breast cancer Maternal Grandmother   ? ? ?The following portions of the patient's history were reviewed and updated as appropriate: allergies, current medications, past family history, past medical history, past social history, past surgical history and problem list. ? ?Review of Systems ?Pertinent items noted in HPI and remainder of comprehensive ROS otherwise negative. ? ?Physical Exam:  ?BP Marland Kitchen)  148/83   Ht 5\' 4"  (1.626 m)   Wt 230 lb 12.8 oz (104.7 kg)   BMI 39.62 kg/m?  ?CONSTITUTIONAL: Well-developed, well-nourished female in no acute distress.  ?HENT:  Normocephalic, atraumatic, External right and left ear normal.  ?EYES: Conjunctivae and EOM are normal. Pupils are equal, round, and reactive to light. No scleral icterus.  ?NECK: Normal range of motion, supple, no masses.  Normal thyroid.  ?SKIN: Skin is warm and dry. No rash noted. Not diaphoretic. No erythema. No pallor. ?MUSCULOSKELETAL: Normal range of motion. No tenderness.  No cyanosis, clubbing, or edema. ?NEUROLOGIC: Alert and oriented to person, place, and time. Normal reflexes, muscle tone coordination.  ?PSYCHIATRIC: Normal mood and affect. Normal  behavior. Normal judgment and thought content. ?CARDIOVASCULAR: Normal heart rate noted, regular rhythm ?RESPIRATORY: Clear to auscultation bilaterally. Effort and breath sounds normal, no problems with respiration noted. ?BREASTS: Symmetric in size. No masses, tenderness, skin changes, nipple drainage, or lymphadenopathy bilaterally. Performed in the presence of a chaperone. ?ABDOMEN: Soft, no distention noted.  No tenderness, rebound or guarding.  ?PELVIC: Normal appearing external genitalia and urethral meatus; normal appearing vaginal mucosa and cervix.  No abnormal vaginal discharge noted.  Pap smear obtained.  Normal uterine size, no other palpable masses, no uterine or adnexal tenderness.  Performed in the presence of a chaperone. ?  ?Assessment and Plan:  ?  1. Oral contraceptive pill surveillance ?Continue Slynd, two samples given to her today. ? ?2. Routine screening for STI (sexually transmitted infection) ?STI screen done, will follow up results and manage accordingly. ?- Cytology - PAP ancillary testing for GC/Chlam/Trich ?- RPR+HBsAg+HCVAb+HIV ? ?3. Well woman exam with routine gynecological exam ?- Cytology - PAP ?Will follow up results of pap smear and manage accordingly. ?Mammogram is up to date. ?Routine preventative health maintenance measures emphasized. ?Please refer to After Visit Summary for other counseling recommendations.  ?   ? ?Verita Schneiders, MD, FACOG ?Obstetrician Social research officer, government, Faculty Practice ?[facility] [place] ?                                                                                                                                                                ?

## 2022-03-07 LAB — RPR+HBSAG+HCVAB+...
HIV Screen 4th Generation wRfx: NONREACTIVE
Hep C Virus Ab: NONREACTIVE
Hepatitis B Surface Ag: NEGATIVE
RPR Ser Ql: NONREACTIVE

## 2022-03-09 LAB — CYTOLOGY - PAP
Comment: NEGATIVE
Diagnosis: NEGATIVE
High risk HPV: NEGATIVE

## 2022-08-04 ENCOUNTER — Other Ambulatory Visit: Payer: Self-pay | Admitting: Obstetrics & Gynecology

## 2022-08-04 DIAGNOSIS — I10 Essential (primary) hypertension: Secondary | ICD-10-CM

## 2022-08-04 DIAGNOSIS — Z3041 Encounter for surveillance of contraceptive pills: Secondary | ICD-10-CM

## 2022-09-15 ENCOUNTER — Other Ambulatory Visit: Payer: Self-pay | Admitting: Obstetrics & Gynecology

## 2022-09-15 DIAGNOSIS — Z1231 Encounter for screening mammogram for malignant neoplasm of breast: Secondary | ICD-10-CM

## 2022-11-21 ENCOUNTER — Ambulatory Visit
Admission: RE | Admit: 2022-11-21 | Discharge: 2022-11-21 | Disposition: A | Discharge status: Home / Self Care | Payer: 59 | Source: Ambulatory Visit | Attending: Obstetrics & Gynecology | Admitting: Obstetrics & Gynecology

## 2022-11-21 DIAGNOSIS — Z1231 Encounter for screening mammogram for malignant neoplasm of breast: Secondary | ICD-10-CM

## 2023-03-10 ENCOUNTER — Encounter: Payer: Self-pay | Admitting: Obstetrics & Gynecology

## 2023-03-10 ENCOUNTER — Other Ambulatory Visit (HOSPITAL_COMMUNITY)
Admission: RE | Admit: 2023-03-10 | Discharge: 2023-03-10 | Disposition: A | Discharge status: Home / Self Care | Payer: 59 | Source: Ambulatory Visit | Attending: Obstetrics & Gynecology | Admitting: Obstetrics & Gynecology

## 2023-03-10 ENCOUNTER — Ambulatory Visit (INDEPENDENT_AMBULATORY_CARE_PROVIDER_SITE_OTHER): Payer: 59 | Admitting: Obstetrics & Gynecology

## 2023-03-10 VITALS — BP 124/81 | HR 76 | Wt 229.0 lb

## 2023-03-10 DIAGNOSIS — Z01419 Encounter for gynecological examination (general) (routine) without abnormal findings: Secondary | ICD-10-CM | POA: Insufficient documentation

## 2023-03-10 DIAGNOSIS — Z3041 Encounter for surveillance of contraceptive pills: Secondary | ICD-10-CM

## 2023-03-10 MED ORDER — SLYND 4 MG PO TABS: 1.0000 | ORAL_TABLET | Freq: Every day | ORAL | 5 refills | Status: DC

## 2023-03-10 NOTE — Progress Notes (Signed)
GYNECOLOGY ANNUAL PREVENTATIVE CARE ENCOUNTER NOTE  History:     Cheryl Martinez is a 47 y.o. [phone] female here for a routine annual gynecologic exam.  Current complaints: none.   Denies abnormal vaginal bleeding, discharge, pelvic pain, problems with intercourse or other gynecologic concerns.    Gynecologic History No LMP recorded. (Menstrual status: Oral contraceptives). Contraception: OCP (estrogen/progesterone) Last Pap: 03/06/2022. Result was normal with negative HPV Last Mammogram: 11/21/2022.  Result was normal  Obstetric History OB History  Gravida Para Term Preterm AB Living  7 3 2 1 3 2   SAB IAB Ectopic Multiple Live Births  3       2    # Outcome Date GA Lbr Len/2nd Weight Sex Delivery Anes PTL Lv  7 Term 03/26/11 [redacted]w[redacted]d  8 lb 6 oz (3.799 kg) M Vag-Spont  N LIV     Birth Comments: Shoulder dystocia  6 Term 12/15/99 [redacted]w[redacted]d  7 lb 2 oz (3.232 kg) F Vag-Spont   LIV  5 Gravida              Birth Comments: System Generated. Please review and update pregnancy details.  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   Vaginal Pap smear, abnormal     Past Surgical History:  Procedure Laterality Date   GALLBLADDER SURGERY  1997   KNEE ARTHROSCOPY WITH PATELLA RECONSTRUCTION Left 02/17/2014   Procedure: LEFT KNEE ARTHROSCOPY WITH LATERAL RELEASE, DEBRIDEMENT/SHAVING (CHONDROPLASTY), SYNOVECTOMY LIMITED WITH RECONSTRUCTION DISLOCATING  PATELLA WITH EXTENSOR REALIGNMENT MUSCLE ADVANCEMENT;  Surgeon: Yvette Rack., MD;  Location: [facility];  Service: Orthopedics;  Laterality: Left;   KNEE SURGERY Right 1994     Current Outpatient Medications on File Prior to Visit  Medication Sig Dispense Refill   levothyroxine (SYNTHROID, LEVOTHROID) 75 MCG tablet Take by mouth.     MOUNJARO 2.5 MG/0.5ML Pen Inject 2.5 mg into the skin once a week.     [DISCONTINUED] venlafaxine (EFFEXOR XR) 37.5 MG 24 hr capsule Take 37.5 mg by mouth daily.     No current facility-administered medications on file prior to visit.    Allergies  Allergen Reactions   Penicillins Itching    Social History:  reports that she has never smoked. She has never used smokeless tobacco. She reports current alcohol use. She reports that she does not use drugs.  Family History  Problem Relation Age of Onset   Diabetes Maternal Grandmother    Cancer Maternal Grandmother        BREAST   Breast cancer Maternal Grandmother     The following portions of the patient's history were reviewed and updated as appropriate: allergies, current medications, past family history, past medical history, past social history, past surgical history and problem list.  Review of Systems Pertinent items noted in HPI and remainder of comprehensive ROS otherwise negative.  Physical Exam:  BP 124/81   Pulse  76   Wt 229 lb (103.9 kg)   BMI 39.31 kg/m  CONSTITUTIONAL: Well-developed, well-nourished female in no acute distress.  HENT:  Normocephalic, atraumatic, External right and left ear normal.  EYES: Conjunctivae and EOM are normal. Pupils are equal, round, and reactive to light. No scleral icterus.  NECK: Normal range of motion, supple, no masses.  Normal thyroid.  SKIN: Skin is warm and dry. No rash noted. Not diaphoretic. No erythema. No pallor. MUSCULOSKELETAL: Normal range of motion. No tenderness.  No cyanosis, clubbing, or edema. NEUROLOGIC: Alert and oriented to person, place, and time. Normal reflexes, muscle tone coordination.  PSYCHIATRIC: Normal mood and affect. Normal behavior. Normal judgment and thought content. CARDIOVASCULAR:  Normal heart rate noted, regular rhythm RESPIRATORY: Clear to auscultation bilaterally. Effort and breath sounds normal, no problems with respiration noted. BREASTS: Symmetric in size. No masses, tenderness, skin changes, nipple drainage, or lymphadenopathy bilaterally. Performed in the presence of a chaperone. ABDOMEN: Soft, obese, no distention noted.  No tenderness, rebound or guarding.  PELVIC: Normal appearing external genitalia and urethral meatus; normal appearing vaginal mucosa and cervix.  No abnormal vaginal discharge noted.  Pap smear obtained.  Normal uterine size, no other palpable masses, no uterine or adnexal tenderness.  Performed in the presence of a chaperone.   Assessment and Plan:     1. Oral contraceptive pill surveillance Slynd refilled as per patient's request. - Drospirenone (SLYND) 4 MG TABS; Take 1 tablet (4 mg total) by mouth daily at 12 noon.  Dispense: 84 tablet; Refill: 5  2. Well woman exam with routine gynecological exam - Cytology - PAP Will follow up results of pap smear and manage accordingly. Mammogram is up to date Colon cancer screening to scheduled once she is 26. Routine preventative health maintenance measures emphasized. Please refer to After Visit Summary for other counseling recommendations.      Verita Schneiders, MD, [place] for [employer], [place]

## 2023-03-13 LAB — CYTOLOGY - PAP
Adequacy: ABSENT
Chlamydia: NEGATIVE
Comment: NEGATIVE
Comment: NEGATIVE
Comment: NEGATIVE
Comment: NORMAL
Diagnosis: NEGATIVE
High risk HPV: NEGATIVE
Neisseria Gonorrhea: NEGATIVE
Trichomonas: NEGATIVE

## 2023-09-21 ENCOUNTER — Other Ambulatory Visit: Payer: Self-pay | Admitting: Obstetrics & Gynecology

## 2023-09-21 DIAGNOSIS — Z1231 Encounter for screening mammogram for malignant neoplasm of breast: Secondary | ICD-10-CM

## 2023-10-27 ENCOUNTER — Ambulatory Visit (INDEPENDENT_AMBULATORY_CARE_PROVIDER_SITE_OTHER): Payer: 59

## 2023-10-27 ENCOUNTER — Ambulatory Visit (INDEPENDENT_AMBULATORY_CARE_PROVIDER_SITE_OTHER): Payer: 59 | Admitting: Podiatry

## 2023-10-27 DIAGNOSIS — M79671 Pain in right foot: Secondary | ICD-10-CM | POA: Diagnosis not present

## 2023-10-27 DIAGNOSIS — M722 Plantar fascial fibromatosis: Secondary | ICD-10-CM | POA: Diagnosis not present

## 2023-10-27 DIAGNOSIS — M79672 Pain in left foot: Secondary | ICD-10-CM

## 2023-10-27 MED ORDER — MELOXICAM 15 MG PO TABS: 15.0000 mg | ORAL_TABLET | Freq: Every day | ORAL | 0 refills | Status: DC

## 2023-10-27 NOTE — Progress Notes (Signed)
  Subjective:  Patient ID: Cheryl Martinez, female    DOB: 1976-08-27,   MRN: [id number]  Chief Complaint  Patient presents with   Plantar Fasciitis    PT PRESENTS FOR BIL HEEL PAIN THAT STARTED A WHILE AGO PT STATED TE PAIN IS WORSE IN THE MORNING WHEN SHE FIRST GETS UP AND WHEN SHE'S BEEN ON HER FEET FOR LONG.    47 y.o. female presents for as above . Denies any other pedal complaints. Denies n/v/f/c.   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 CFT <3 seconds. Normal hair growth on digits. No edema.  Skin. No lacerations or abrasions bilateral feet.  Musculoskeletal: MMT 5/5 bilateral lower extremities in DF, PF, Inversion and Eversion. Deceased ROM in DF of ankle joint. Tender to the medial calcaneal tubercle bilaterally . No pain with achilles, PT or arch. No pain with calcaneal squeeze.   Neurological: Sensation intact to light touch.   Assessment:   1. Plantar fasciitis, right   2. Plantar fasciitis, left      Plan:  Patient was evaluated and treated and all questions answered. Discussed plantar fasciitis with patient.  X-rays reviewed and discussed with patient. No acute fractures or dislocations noted. Mild spurring noted at inferior calcaneus.  Discussed treatment options including, ice, NSAIDS, supportive shoes, bracing, and stretching. Stretching exercises provided to be done on a daily basis.   Prescription for meloxicam provided and sent to pharmacy. PF brace dispensed.  Follow-up 6 weeks or sooner if any problems arise. In the meantime, encouraged to call the office with any questions, concerns, change in symptoms.      Louann Sjogren, DPM

## 2023-10-27 NOTE — Patient Instructions (Signed)

## 2023-11-23 ENCOUNTER — Ambulatory Visit
Admission: RE | Admit: 2023-11-23 | Discharge: 2023-11-23 | Disposition: A | Discharge status: Home / Self Care | Payer: 59 | Source: Ambulatory Visit | Attending: Obstetrics & Gynecology | Admitting: Obstetrics & Gynecology

## 2023-11-23 DIAGNOSIS — Z1231 Encounter for screening mammogram for malignant neoplasm of breast: Secondary | ICD-10-CM

## 2023-11-29 ENCOUNTER — Other Ambulatory Visit: Payer: Self-pay | Admitting: Podiatry

## 2023-12-14 ENCOUNTER — Ambulatory Visit: Payer: 59 | Admitting: Podiatry

## 2024-03-10 ENCOUNTER — Encounter: Payer: Self-pay | Admitting: Obstetrics & Gynecology

## 2024-03-10 ENCOUNTER — Other Ambulatory Visit (HOSPITAL_COMMUNITY)
Admission: RE | Admit: 2024-03-10 | Discharge: 2024-03-10 | Disposition: A | Discharge status: Home / Self Care | Source: Ambulatory Visit | Attending: Obstetrics & Gynecology | Admitting: Obstetrics & Gynecology

## 2024-03-10 ENCOUNTER — Other Ambulatory Visit: Payer: Self-pay | Admitting: Obstetrics & Gynecology

## 2024-03-10 ENCOUNTER — Ambulatory Visit: Payer: BC Managed Care – PPO | Admitting: Obstetrics & Gynecology

## 2024-03-10 VITALS — BP 138/79 | HR 77 | Ht 65.0 in | Wt 226.0 lb

## 2024-03-10 DIAGNOSIS — Z113 Encounter for screening for infections with a predominantly sexual mode of transmission: Secondary | ICD-10-CM | POA: Insufficient documentation

## 2024-03-10 DIAGNOSIS — Z01419 Encounter for gynecological examination (general) (routine) without abnormal findings: Secondary | ICD-10-CM | POA: Diagnosis not present

## 2024-03-10 DIAGNOSIS — Z1339 Encounter for screening examination for other mental health and behavioral disorders: Secondary | ICD-10-CM

## 2024-03-10 DIAGNOSIS — Z0000 Encounter for general adult medical examination without abnormal findings: Secondary | ICD-10-CM

## 2024-03-10 DIAGNOSIS — Z3041 Encounter for surveillance of contraceptive pills: Secondary | ICD-10-CM | POA: Diagnosis not present

## 2024-03-10 MED ORDER — SLYND 4 MG PO TABS: 1.0000 | ORAL_TABLET | Freq: Every day | ORAL | 5 refills | Status: AC

## 2024-03-10 NOTE — Progress Notes (Signed)
 Patient presents for Annual, no complaints.  LMP: No cycles with BCP's Last pap: Date: 03/10/23 WNL  Contraception: OCP Needs refills through [facility] Mammogram: Up to date: 11/23/23 WNL  STD Screening: Accepts  Medication Samples have been provided to the patient.  Drug name: Slynd       Strength: 4mg         Qty: 3 boxes  LOT: [id number]  Exp.Date: 10/2025  Dosing instructions: as directed  The patient has been instructed regarding the correct time, dose, and frequency of taking this medication, including desired effects and most common side effects.   Kennon Portela 10:39 AM 03/10/2024

## 2024-03-10 NOTE — Progress Notes (Addendum)
 GYNECOLOGY ANNUAL PREVENTATIVE CARE ENCOUNTER NOTE  History:     Cheryl Martinez is a 48 y.o. [phone] female here for a routine annual exam.  Current complaints: none.  Had physical exam with PCP last week. Desirs annual STI screen and refill of Slynd.  Denies abnormal vaginal bleeding, discharge, pelvic pain, problems with intercourse or other gynecologic concerns.    Gynecologic History No LMP recorded. (Menstrual status: Oral contraceptives). Contraception: oral progesterone-only contraceptive (Slynd), needs refill Last Pap: 03/10/2023. Result was normal with negative HPV Last Mammogram: 11/23/2023.  Result was normal Last Colonoscopy: Never done, but has supplies for Cologuard.    Obstetric History OB History  Gravida Para Term Preterm AB Living  7 3 2 1 3 2   SAB IAB Ectopic Multiple Live Births  3    2    # Outcome Date GA Lbr Len/2nd Weight Sex Type Anes PTL Lv  7 Term 03/26/11 [redacted]w[redacted]d  8 lb 6 oz (3.799 kg) M Vag-Spont  N LIV     Birth Comments: Shoulder dystocia  6 Term 12/15/99 [redacted]w[redacted]d  7 lb 2 oz (3.232 kg) F Vag-Spont   LIV  5 Gravida              Birth Comments: System Generated. Please review and update pregnancy details.  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   Vaginal Pap smear, abnormal     Past Surgical History:  Procedure Laterality Date   GALLBLADDER SURGERY  1997   KNEE ARTHROSCOPY WITH PATELLA RECONSTRUCTION Left 02/17/2014   Procedure: LEFT KNEE ARTHROSCOPY WITH LATERAL RELEASE, DEBRIDEMENT/SHAVING (CHONDROPLASTY), SYNOVECTOMY LIMITED WITH RECONSTRUCTION DISLOCATING  PATELLA WITH EXTENSOR REALIGNMENT  MUSCLE ADVANCEMENT;  Surgeon: Thera Flake., MD;  Location: [facility];  Service: Orthopedics;  Laterality: Left;   KNEE SURGERY Right 1994    Current Outpatient Medications on File Prior to Visit  Medication Sig Dispense Refill   levothyroxine (SYNTHROID, LEVOTHROID) 75 MCG tablet Take by mouth.     [DISCONTINUED] venlafaxine (EFFEXOR XR) 37.5 MG 24 hr capsule Take 37.5 mg by mouth daily.     No current facility-administered medications on file prior to visit.    Allergies  Allergen Reactions   Penicillins Itching    Social History:  reports that she has never smoked. She has never used smokeless tobacco. She reports current alcohol use. She reports that she does not use drugs.  Family History  Problem Relation Age of Onset   Diabetes Maternal Grandmother    Cancer Maternal Grandmother        BREAST   Breast cancer Maternal Grandmother     The following portions of the patient's history were reviewed and updated as appropriate: allergies, current medications, past family history, past medical history, past social history, past surgical history and problem list.  Review of Systems Pertinent items noted in HPI and remainder of comprehensive ROS otherwise negative.  Physical Exam:  BP 138/79   Pulse 77   Ht 5\' 5"  (1.651 m)   Wt 226 lb (102.5 kg)   BMI 37.61 kg/m  CONSTITUTIONAL: Well-developed, well-nourished female in no acute distress.  HENT:  Normocephalic, atraumatic, External right and left ear normal.  EYES: Conjunctivae and EOM are normal. Pupils are equal, round, and reactive to light. No scleral icterus.  NECK: Normal range of motion, supple, no masses noted.   SKIN: Skin is warm and dry. No rash noted. Not diaphoretic. No erythema. No pallor. MUSCULOSKELETAL: Normal range of motion. No tenderness.  No cyanosis, clubbing, or edema. NEUROLOGIC: Alert and oriented to person, place, and time. Normal reflexes, muscle tone coordination.  PSYCHIATRIC:  Normal mood and affect. Normal behavior. Normal judgment and thought content. CARDIOVASCULAR: Normal heart rate noted, regular rhythm RESPIRATORY: Clear to auscultation bilaterally. Effort and breath sounds normal, no problems with respiration noted. BREASTS: Deferred by patient ABDOMEN: Soft, no distention noted.  No tenderness, rebound or guarding.  PELVIC: Deferred by patient   Assessment and Plan:    1. Oral contraceptive pill surveillance Three samples of Slynd given, also refilled her prescription. She is satisfied with method. - Drospirenone (SLYND) 4 MG TABS; Take 1 tablet (4 mg total) by mouth daily at 12 noon.  Dispense: 84 tablet; Refill: 5  2. Routine screening for STI (sexually transmitted infection) STI screen done as requested, will follow up results and manage accordingly. - Urine cytology ancillary only - RPR+HBsAg+HCVAb+HIV  3. Well woman exam without gynecological exam (Primary) Pap smear and mammogram  are up to date She will complete the Cologuard collection soon and send this in. Emphasized that positive Cologuard tests will need to be follow up with diagnostic colonoscopy.  Discussed possibility of perimenopausal symptoms at this point of life, and expected symptoms.  Patient denies any concerning symptoms for now. Routine preventative health maintenance measures emphasized.  Please refer to After Visit Summary for other counseling recommendations.       [name], MD, FACOG Obstetrician & Gynecologist, [facility] for [employer], [facility]

## 2024-03-11 ENCOUNTER — Encounter: Payer: Self-pay | Admitting: Obstetrics & Gynecology

## 2024-03-11 LAB — URINE CYTOLOGY ANCILLARY ONLY
Chlamydia: NEGATIVE
Comment: NEGATIVE
Comment: NEGATIVE
Comment: NORMAL
Neisseria Gonorrhea: NEGATIVE
Trichomonas: NEGATIVE

## 2024-03-11 LAB — RPR+HBSAG+HCVAB+...
HIV Screen 4th Generation wRfx: NONREACTIVE
Hep C Virus Ab: NONREACTIVE
Hepatitis B Surface Ag: NEGATIVE
RPR Ser Ql: NONREACTIVE

## 2024-03-14 ENCOUNTER — Encounter: Payer: Self-pay | Admitting: Obstetrics & Gynecology

## 2025-03-23 ENCOUNTER — Ambulatory Visit: Admitting: Obstetrics & Gynecology
